# Patient Record
Sex: Male | Born: 1950
Health system: Southern US, Community
[De-identification: ages and names within clinical notes are randomized; demographics above are authoritative.]

## PROBLEM LIST (undated history)

## (undated) DIAGNOSIS — J45909 Unspecified asthma, uncomplicated: Secondary | ICD-10-CM

## (undated) DIAGNOSIS — I10 Essential (primary) hypertension: Secondary | ICD-10-CM

## (undated) DIAGNOSIS — T783XXA Angioneurotic edema, initial encounter: Secondary | ICD-10-CM

## (undated) HISTORY — DX: Essential (primary) hypertension: I10

## (undated) HISTORY — DX: Angioneurotic edema, initial encounter: T78.3XXA

## (undated) HISTORY — DX: Unspecified asthma, uncomplicated: J45.909

---

## 2016-04-09 DIAGNOSIS — R0982 Postnasal drip: Secondary | ICD-10-CM | POA: Diagnosis not present

## 2016-04-09 DIAGNOSIS — J309 Allergic rhinitis, unspecified: Secondary | ICD-10-CM | POA: Diagnosis not present

## 2016-04-09 DIAGNOSIS — F4322 Adjustment disorder with anxiety: Secondary | ICD-10-CM | POA: Diagnosis not present

## 2016-04-09 DIAGNOSIS — R0602 Shortness of breath: Secondary | ICD-10-CM | POA: Diagnosis not present

## 2016-05-21 DIAGNOSIS — R0609 Other forms of dyspnea: Secondary | ICD-10-CM | POA: Diagnosis not present

## 2016-06-10 DIAGNOSIS — R0609 Other forms of dyspnea: Secondary | ICD-10-CM | POA: Diagnosis not present

## 2016-07-02 DIAGNOSIS — J189 Pneumonia, unspecified organism: Secondary | ICD-10-CM | POA: Diagnosis not present

## 2016-07-02 DIAGNOSIS — R918 Other nonspecific abnormal finding of lung field: Secondary | ICD-10-CM | POA: Diagnosis not present

## 2016-07-02 DIAGNOSIS — R0602 Shortness of breath: Secondary | ICD-10-CM | POA: Diagnosis not present

## 2016-07-10 DIAGNOSIS — Z0000 Encounter for general adult medical examination without abnormal findings: Secondary | ICD-10-CM | POA: Diagnosis not present

## 2016-07-22 DIAGNOSIS — J189 Pneumonia, unspecified organism: Secondary | ICD-10-CM | POA: Diagnosis not present

## 2016-08-07 DIAGNOSIS — J189 Pneumonia, unspecified organism: Secondary | ICD-10-CM | POA: Diagnosis not present

## 2016-08-20 DIAGNOSIS — J189 Pneumonia, unspecified organism: Secondary | ICD-10-CM | POA: Diagnosis not present

## 2016-08-20 DIAGNOSIS — Z23 Encounter for immunization: Secondary | ICD-10-CM | POA: Diagnosis not present

## 2016-08-20 DIAGNOSIS — R0602 Shortness of breath: Secondary | ICD-10-CM | POA: Diagnosis not present

## 2016-08-20 DIAGNOSIS — Z125 Encounter for screening for malignant neoplasm of prostate: Secondary | ICD-10-CM | POA: Diagnosis not present

## 2016-08-20 DIAGNOSIS — Z0000 Encounter for general adult medical examination without abnormal findings: Secondary | ICD-10-CM | POA: Diagnosis not present

## 2016-08-20 DIAGNOSIS — B353 Tinea pedis: Secondary | ICD-10-CM | POA: Diagnosis not present

## 2016-08-20 DIAGNOSIS — R7309 Other abnormal glucose: Secondary | ICD-10-CM | POA: Diagnosis not present

## 2016-10-13 DIAGNOSIS — J449 Chronic obstructive pulmonary disease, unspecified: Secondary | ICD-10-CM | POA: Diagnosis not present

## 2016-10-31 DIAGNOSIS — H2511 Age-related nuclear cataract, right eye: Secondary | ICD-10-CM | POA: Diagnosis not present

## 2016-11-11 DIAGNOSIS — J449 Chronic obstructive pulmonary disease, unspecified: Secondary | ICD-10-CM | POA: Diagnosis not present

## 2016-11-11 DIAGNOSIS — K219 Gastro-esophageal reflux disease without esophagitis: Secondary | ICD-10-CM | POA: Diagnosis not present

## 2016-11-11 DIAGNOSIS — H2511 Age-related nuclear cataract, right eye: Secondary | ICD-10-CM | POA: Diagnosis not present

## 2016-11-11 DIAGNOSIS — Z87891 Personal history of nicotine dependence: Secondary | ICD-10-CM | POA: Diagnosis not present

## 2016-11-11 DIAGNOSIS — Z79899 Other long term (current) drug therapy: Secondary | ICD-10-CM | POA: Diagnosis not present

## 2016-11-11 DIAGNOSIS — H9190 Unspecified hearing loss, unspecified ear: Secondary | ICD-10-CM | POA: Diagnosis not present

## 2017-02-12 DIAGNOSIS — K219 Gastro-esophageal reflux disease without esophagitis: Secondary | ICD-10-CM | POA: Diagnosis not present

## 2017-02-12 DIAGNOSIS — J449 Chronic obstructive pulmonary disease, unspecified: Secondary | ICD-10-CM | POA: Diagnosis not present

## 2017-02-12 DIAGNOSIS — C44519 Basal cell carcinoma of skin of other part of trunk: Secondary | ICD-10-CM | POA: Diagnosis not present

## 2017-02-18 DIAGNOSIS — L820 Inflamed seborrheic keratosis: Secondary | ICD-10-CM | POA: Diagnosis not present

## 2017-02-18 DIAGNOSIS — L570 Actinic keratosis: Secondary | ICD-10-CM | POA: Diagnosis not present

## 2017-02-18 DIAGNOSIS — L821 Other seborrheic keratosis: Secondary | ICD-10-CM | POA: Diagnosis not present

## 2017-02-18 DIAGNOSIS — D1801 Hemangioma of skin and subcutaneous tissue: Secondary | ICD-10-CM | POA: Diagnosis not present

## 2017-08-31 DIAGNOSIS — R109 Unspecified abdominal pain: Secondary | ICD-10-CM | POA: Diagnosis not present

## 2017-08-31 DIAGNOSIS — Z23 Encounter for immunization: Secondary | ICD-10-CM | POA: Diagnosis not present

## 2017-08-31 DIAGNOSIS — Z6828 Body mass index (BMI) 28.0-28.9, adult: Secondary | ICD-10-CM | POA: Diagnosis not present

## 2017-09-10 DIAGNOSIS — K5792 Diverticulitis of intestine, part unspecified, without perforation or abscess without bleeding: Secondary | ICD-10-CM | POA: Diagnosis not present

## 2017-09-10 DIAGNOSIS — K219 Gastro-esophageal reflux disease without esophagitis: Secondary | ICD-10-CM | POA: Diagnosis not present

## 2017-09-10 DIAGNOSIS — Z9181 History of falling: Secondary | ICD-10-CM | POA: Diagnosis not present

## 2017-09-10 DIAGNOSIS — J449 Chronic obstructive pulmonary disease, unspecified: Secondary | ICD-10-CM | POA: Diagnosis not present

## 2017-09-10 DIAGNOSIS — Z1389 Encounter for screening for other disorder: Secondary | ICD-10-CM | POA: Diagnosis not present

## 2017-09-10 DIAGNOSIS — E663 Overweight: Secondary | ICD-10-CM | POA: Diagnosis not present

## 2017-10-26 DIAGNOSIS — J449 Chronic obstructive pulmonary disease, unspecified: Secondary | ICD-10-CM | POA: Diagnosis not present

## 2017-10-27 DIAGNOSIS — Z6828 Body mass index (BMI) 28.0-28.9, adult: Secondary | ICD-10-CM | POA: Diagnosis not present

## 2017-10-27 DIAGNOSIS — M940 Chondrocostal junction syndrome [Tietze]: Secondary | ICD-10-CM | POA: Diagnosis not present

## 2017-10-27 DIAGNOSIS — Z1339 Encounter for screening examination for other mental health and behavioral disorders: Secondary | ICD-10-CM | POA: Diagnosis not present

## 2017-10-27 DIAGNOSIS — J441 Chronic obstructive pulmonary disease with (acute) exacerbation: Secondary | ICD-10-CM | POA: Diagnosis not present

## 2017-10-27 DIAGNOSIS — Z0000 Encounter for general adult medical examination without abnormal findings: Secondary | ICD-10-CM | POA: Diagnosis not present

## 2017-11-26 DIAGNOSIS — K219 Gastro-esophageal reflux disease without esophagitis: Secondary | ICD-10-CM | POA: Diagnosis not present

## 2017-11-26 DIAGNOSIS — K4191 Unilateral femoral hernia, without obstruction or gangrene, recurrent: Secondary | ICD-10-CM | POA: Diagnosis not present

## 2017-11-26 DIAGNOSIS — Z23 Encounter for immunization: Secondary | ICD-10-CM | POA: Diagnosis not present

## 2017-11-26 DIAGNOSIS — J449 Chronic obstructive pulmonary disease, unspecified: Secondary | ICD-10-CM | POA: Diagnosis not present

## 2017-11-26 DIAGNOSIS — G250 Essential tremor: Secondary | ICD-10-CM | POA: Diagnosis not present

## 2017-12-17 DIAGNOSIS — R1012 Left upper quadrant pain: Secondary | ICD-10-CM | POA: Diagnosis not present

## 2017-12-23 DIAGNOSIS — R1012 Left upper quadrant pain: Secondary | ICD-10-CM | POA: Diagnosis not present

## 2017-12-23 DIAGNOSIS — N200 Calculus of kidney: Secondary | ICD-10-CM | POA: Diagnosis not present

## 2018-01-29 ENCOUNTER — Other Ambulatory Visit (INDEPENDENT_AMBULATORY_CARE_PROVIDER_SITE_OTHER): Payer: PPO

## 2018-01-29 ENCOUNTER — Ambulatory Visit: Payer: PPO | Admitting: Internal Medicine

## 2018-01-29 ENCOUNTER — Ambulatory Visit (INDEPENDENT_AMBULATORY_CARE_PROVIDER_SITE_OTHER)
Admission: RE | Admit: 2018-01-29 | Discharge: 2018-01-29 | Disposition: A | Discharge status: Home / Self Care | Payer: PPO | Source: Ambulatory Visit | Attending: Internal Medicine | Admitting: Internal Medicine

## 2018-01-29 ENCOUNTER — Encounter: Payer: Self-pay | Admitting: Internal Medicine

## 2018-01-29 VITALS — BP 132/80 | HR 85 | Ht 71.0 in | Wt 203.6 lb

## 2018-01-29 DIAGNOSIS — R0609 Other forms of dyspnea: Secondary | ICD-10-CM | POA: Insufficient documentation

## 2018-01-29 DIAGNOSIS — J45991 Cough variant asthma: Secondary | ICD-10-CM | POA: Diagnosis not present

## 2018-01-29 DIAGNOSIS — R059 Cough, unspecified: Secondary | ICD-10-CM

## 2018-01-29 DIAGNOSIS — R0602 Shortness of breath: Secondary | ICD-10-CM | POA: Diagnosis not present

## 2018-01-29 DIAGNOSIS — R05 Cough: Secondary | ICD-10-CM

## 2018-01-29 LAB — CBC WITH DIFFERENTIAL/PLATELET
BASOS ABS: 0.1 10*3/uL (ref 0.0–0.1)
Basophils Relative: 1 % (ref 0.0–3.0)
EOS ABS: 0.3 10*3/uL (ref 0.0–0.7)
Eosinophils Relative: 4 % (ref 0.0–5.0)
HEMATOCRIT: 45.8 % (ref 39.0–52.0)
Hemoglobin: 15.5 g/dL (ref 13.0–17.0)
LYMPHS PCT: 21 % (ref 12.0–46.0)
Lymphs Abs: 1.8 10*3/uL (ref 0.7–4.0)
MCHC: 33.8 g/dL (ref 30.0–36.0)
MCV: 92.4 fl (ref 78.0–100.0)
MONOS PCT: 13.7 % — AB (ref 3.0–12.0)
Monocytes Absolute: 1.2 10*3/uL — ABNORMAL HIGH (ref 0.1–1.0)
Neutro Abs: 5.3 10*3/uL (ref 1.4–7.7)
Neutrophils Relative %: 60.3 % (ref 43.0–77.0)
Platelets: 375 10*3/uL (ref 150.0–400.0)
RBC: 4.96 Mil/uL (ref 4.22–5.81)
RDW: 13.4 % (ref 11.5–15.5)
WBC: 8.7 10*3/uL (ref 4.0–10.5)

## 2018-01-29 LAB — SEDIMENTATION RATE: SED RATE: 13 mm/h (ref 0–20)

## 2018-01-29 LAB — TSH: TSH: 1.78 u[IU]/mL (ref 0.35–4.50)

## 2018-01-29 LAB — BRAIN NATRIURETIC PEPTIDE: PRO B NATRI PEPTIDE: 37 pg/mL (ref 0.0–100.0)

## 2018-01-29 MED ORDER — MOMETASONE FURO-FORMOTEROL FUM 100-5 MCG/ACT IN AERO: 2.0000 | INHALATION_SPRAY | Freq: Two times a day (BID) | RESPIRATORY_TRACT | 11 refills | Status: DC

## 2018-01-29 MED ORDER — PANTOPRAZOLE SODIUM 40 MG PO TBEC: 40.0000 mg | DELAYED_RELEASE_TABLET | Freq: Every day | ORAL | 2 refills | Status: DC

## 2018-01-29 MED ORDER — FAMOTIDINE 20 MG PO TABS: ORAL_TABLET | ORAL | 2 refills | Status: DC

## 2018-01-29 MED ORDER — MOMETASONE FURO-FORMOTEROL FUM 100-5 MCG/ACT IN AERO: 2.0000 | INHALATION_SPRAY | Freq: Two times a day (BID) | RESPIRATORY_TRACT | 0 refills | Status: DC

## 2018-01-29 NOTE — Assessment & Plan Note (Addendum)
Spirometry 01/29/2018  Abn effort dep portion only  - 01/29/2018  After extensive coaching inhaler device  effectiveness =   75 % start dulera 100  2bid and d/c trelegy as this is not typical copd at all    Not clear whether he actually has a lung problem but clearly not copd so try max rx for gerd/ change to low dose dulera and await sinus ct/ allergy profile then regroup in 6 weeks

## 2018-01-29 NOTE — Assessment & Plan Note (Signed)
01/29/2018  Walked RA x 3 laps @ 185 ft each stopped due to  End of study, fast pace, no  desat  But sob at end    Symptoms are markedly disproportionate to objective findings and not clear this is actually much of a  lung problem but pt does appear to have difficult to sort out respiratory symptoms of unknown origin for which  DDX  = almost all start with A and  include Adherence, Ace Inhibitors, Acid Reflux, Active Sinus Disease, Alpha 1 Antitripsin deficiency, Anxiety masquerading as Airways dz,  ABPA,  Allergy(esp in young), Aspiration (esp in elderly), Adverse effects of meds,  Active smokers, A bunch of PE's (a small clot burden can't cause this syndrome unless there is already severe underlying pulm or vascular dz with poor reserve), Anemia and thyroid dz  plus two Bs  = Bronchiectasis and Beta blocker use..and one C= CHF    Adherence is always the initial "prime suspect" and is a multilayered concern that requires a "trust but verify" approach in every patient - starting with knowing how to use medications, especially inhalers, correctly, keeping up with refills and understanding the fundamental difference between maintenance and prns vs those medications only taken for a very short course and then stopped and not refilled.  - see hfa teaching - return with all meds in hand using a trust but verify approach to confirm accurate Medication  Reconciliation The principal here is that until we are certain that the  patients are doing what we've asked, it makes no sense to ask them to do more.    ? Acid (or non-acid) GERD > always difficult to exclude as up to 75% of pts in some series report no assoc GI/ Heartburn symptoms> rec max (24h)  acid suppression and diet restrictions/ reviewed and instructions given in writing.    ? Adverse effects of dpi > change to hfa low dose ics   ? Active sinus dz > check ct next  ? Allergy/ asthma > send profile/ rx with low dose ics only for now  ? Anemia/  thyroid dz > excluded by today's labs  ? CHF > excluded by such a low bnp   Total time devoted to counseling  > 50 % of initial 60 min office visit:  review case with pt/ discussion of options/alternatives/ personally creating written customized instructions  in presence of pt  then going over those specific  Instructions directly with the pt including how to use all of the meds but in particular covering each new medication in detail and the difference between the maintenance= "automatic" meds and the prns using an action plan format for the latter (If this problem/symptom => do that organization reading Left to right).  Please see AVS from this visit for a full list of these instructions which I personally wrote for this pt and  are unique to this visit.

## 2018-01-29 NOTE — Patient Instructions (Addendum)
Plan A = Automatic =  Stop trelegy and start Dulera 100 Take 2 puffs first thing in am and then another 2 puffs about 12 hours later.   Pantoprazole (protonix) 40 mg  (Prilosec 20 x 2)  Take  30-60 min before first meal of the day and Pepcid (famotidine)  20 mg one hour before bedtime until return to office - this is the best way to tell whether stomach acid is contributing to your problem.     Plan B = Backup for breathing  Only use your duoneb if you can't catch your breath.  Ok to use up to every 4 hours if needed   Plan B = Backup  For cough cough > mucinex dm up to 1200 mg every 12 hours if needed   Please see patient coordinator before you leave today  to schedule Sinus CT    Please remember to go to the lab and x-ray department downstairs in the basement  for your tests - we will call you with the results when they are available.     Please schedule a follow up office visit in 6 weeks, call sooner if needed with all medications /inhalers/ solutions in hand so we can verify exactly what you are taking. This includes all medications from all doctors and over the counters

## 2018-01-29 NOTE — Progress Notes (Signed)
Subjective:     Patient ID: Joshua Grant, male   DOB: [birth year],    MRN: [id number]  HPI   69 yowm quit smoking 2012 with h/o sinus problems from his late 15s requiring surgery in 1990's which helped for while with new onset sob and cough onset fall 2017 and during this same time more nasal discharge of green mucus and multiple abx did not help so referred to pulmonary clinic 01/29/2018  By Dr Morrison Old      01/29/2018 1st [facility] office visit/ Casmira Cramer   Chief Complaint  Patient presents with  . Pulmonary Consult    Self referral. Pt c/o DOE for the past year. He states he gets winded with lifting things and with walking up an incline. He also c/o prod cough with green sputum for the past wk.   symptoms insidious onset/ persisten daily pattern: at onset of day has slept at 45 degrees and lots of am congestion green x couple tbsp seems better during the day and worse p supper / before bed does better breathe right and 45 degrees  No better with trelegy  Neb helps the most  Doe = .MMRC1 = can walk nl pace, flat grade, can't hurry or go uphills or steps s sob     No obvious day to day or daytime variability or assoc  mucus plugs or hemoptysis or cp or chest tightness, subjective wheeze or overt sinus or hb symptoms. No unusual exposure hx or h/o childhood pna/ asthma or knowledge of premature birth.    Also denies any obvious fluctuation of symptoms with weather or environmental changes or other aggravating or alleviating factors except as outlined above   Current Allergies, Complete Past Medical History, Past Surgical History, Family History, and Social History were reviewed in [employer] record.  ROS  The following are not active complaints unless bolded Hoarseness, sore throat, dysphagia, dental problems, itching, sneezing,  nasal congestion or discharge of excess mucus or purulent secretions, ear ache,   fever, chills, sweats, unintended wt loss or wt gain,  classically pleuritic or exertional cp,  orthopnea pnd or leg swelling, presyncope, palpitations, abdominal pain, anorexia, nausea, vomiting, diarrhea  or change in bowel habits or change in bladder habits, change in stools or change in urine, dysuria, hematuria,  rash, arthralgias, visual complaints, headache, numbness, weakness or ataxia or problems with walking or coordination,  change in mood/affect or memory.        Current Meds  Medication Sig  . Fluticasone-Umeclidin-Vilant (TRELEGY ELLIPTA) 100-62.5-25 MCG/INH AEPB Inhale 1 tablet into the lungs daily.  Marland Kitchen ipratropium-albuterol (DUONEB) 0.5-2.5 (3) MG/3ML SOLN Take 3 mLs by nebulization every 4 (four) hours as needed.  prilosec 20mg   At hs         Review of Systems     Objective:   Physical Exam Animated pleasant amb wm nad  Wt Readings from Last 3 Encounters:  01/29/18 203 lb 9.6 oz (92.4 kg)     Vital signs reviewed - Note on arrival 02 sats  96% on RA   HEENT: nl   oropharynx. Nl external ear canals without cough reflex - moderate bilateral non-specific turbinate edema  / edentulous   NECK :  without JVD/Nodes/TM/ nl carotid upstrokes bilaterally   LUNGS: no acc muscle use,  Nl contour chest with mostly pseudowheeze on exam    CV:  RRR  no s3 or murmur or increase in P2, and no edema   ABD:  soft  and nontender with nl inspiratory excursion in the supine position. No bruits or organomegaly appreciated, bowel sounds nl  MS:  Nl gait/ ext warm without deformities, calf tenderness, cyanosis or clubbing No obvious joint restrictions   SKIN: warm and dry without lesions    NEURO:  alert, approp, nl sensorium with  no motor or cerebellar deficits apparent.       CXR PA and Lateral:   01/29/2018 :    I personally reviewed images   impression as follows:    mild copd/ coarse markings    Labs ordered/ reviewed:     Lab Results  Component Value Date   WBC 8.7 01/29/2018   HGB 15.5 01/29/2018   HCT 45.8  01/29/2018   MCV 92.4 01/29/2018   PLT 375.0 01/29/2018       Eos                         0.3                                                                  01/29/2018       Lab Results  Component Value Date   TSH 1.78 01/29/2018     Lab Results  Component Value Date   PROBNP 37.0 01/29/2018       Lab Results  Component Value Date   ESRSEDRATE 13 01/29/2018       Labs ordered 01/29/2018    Allergy profile     Assessment:

## 2018-02-01 ENCOUNTER — Telehealth: Payer: Self-pay | Admitting: Internal Medicine

## 2018-02-01 LAB — RESPIRATORY ALLERGY PROFILE REGION II ~~LOC~~
ALLERGEN, CEDAR TREE, T6: 0.58 kU/L — AB
ALLERGEN, OAK, T7: 0.77 kU/L — AB
ASPERGILLUS FUMIGATUS M3: 0.73 kU/L — AB
Allergen, A. alternata, m6: 0.56 kU/L — ABNORMAL HIGH
Allergen, Comm Silver Birch, t9: 0.49 kU/L — ABNORMAL HIGH
Allergen, Cottonwood, t14: 0.64 kU/L — ABNORMAL HIGH
Allergen, D pternoyssinus,d7: 0.56 kU/L — ABNORMAL HIGH
Allergen, Mulberry, t76: 0.51 kU/L — ABNORMAL HIGH
Allergen, P. notatum, m1: 0.43 kU/L — ABNORMAL HIGH
BOX ELDER: 0.67 kU/L — AB
Bermuda Grass: 0.92 kU/L — ABNORMAL HIGH
CLADOSPORIUM HERBARUM (M2) IGE: 0.14 kU/L — ABNORMAL HIGH
CLASS: 1
CLASS: 1
CLASS: 1
CLASS: 1
CLASS: 1
CLASS: 2
CLASS: 2
CLASS: 2
CLASS: 2
CLASS: 3
COCKROACH: 12.6 kU/L — AB
COMMON RAGWEED (SHORT) (W1) IGE: 0.78 kU/L — ABNORMAL HIGH
Cat Dander: 0.59 kU/L — ABNORMAL HIGH
Class: 0
Class: 0
Class: 1
Class: 1
Class: 1
Class: 1
Class: 1
Class: 1
Class: 1
Class: 1
Class: 1
Class: 2
Class: 2
Class: 2
D. FARINAE: 2.01 kU/L — AB
Dog Dander: 0.64 kU/L — ABNORMAL HIGH
ELM IGE: 0.69 kU/L — AB
IgE (Immunoglobulin E), Serum: 702 kU/L — ABNORMAL HIGH (ref <=114)
Johnson Grass: 0.97 kU/L — ABNORMAL HIGH
Pecan/Hickory Tree IgE: 0.56 kU/L — ABNORMAL HIGH
ROUGH PIGWEED IGE: 0.69 kU/L — AB
Sheep Sorrel IgE: 0.7 kU/L — ABNORMAL HIGH
TIMOTHY GRASS: 0.68 kU/L — AB

## 2018-02-01 LAB — INTERPRETATION:

## 2018-02-01 NOTE — Telephone Encounter (Signed)
Patient is aware of results. Verbalized understanding. Nothing further needed. 

## 2018-02-01 NOTE — Progress Notes (Signed)
LMTCB

## 2018-02-02 NOTE — Progress Notes (Signed)
Spoke with pt and notified of results per Dr. Wert. Pt verbalized understanding and denied any questions. 

## 2018-02-08 ENCOUNTER — Ambulatory Visit (INDEPENDENT_AMBULATORY_CARE_PROVIDER_SITE_OTHER)
Admission: RE | Admit: 2018-02-08 | Discharge: 2018-02-08 | Disposition: A | Discharge status: Home / Self Care | Payer: PPO | Source: Ambulatory Visit | Attending: Internal Medicine | Admitting: Internal Medicine

## 2018-02-08 DIAGNOSIS — R05 Cough: Secondary | ICD-10-CM

## 2018-02-08 DIAGNOSIS — J45991 Cough variant asthma: Secondary | ICD-10-CM

## 2018-02-08 DIAGNOSIS — R059 Cough, unspecified: Secondary | ICD-10-CM

## 2018-02-08 DIAGNOSIS — J0121 Acute recurrent ethmoidal sinusitis: Secondary | ICD-10-CM | POA: Diagnosis not present

## 2018-02-09 ENCOUNTER — Telehealth: Payer: Self-pay | Admitting: Internal Medicine

## 2018-02-09 DIAGNOSIS — J329 Chronic sinusitis, unspecified: Secondary | ICD-10-CM

## 2018-02-09 NOTE — Telephone Encounter (Signed)
Called and spoke with pt giving him the results of the ct scan of sinus and that MW wanted pt to be referred to ENT.  Pt and his wife both expressed understanding. Order placed for the referral to ENT.  Nothing further needed at this current time.

## 2018-02-09 NOTE — Progress Notes (Signed)
LMTCB

## 2018-02-16 DIAGNOSIS — J329 Chronic sinusitis, unspecified: Secondary | ICD-10-CM | POA: Diagnosis not present

## 2018-02-16 DIAGNOSIS — R05 Cough: Secondary | ICD-10-CM | POA: Diagnosis not present

## 2018-02-18 ENCOUNTER — Telehealth: Payer: Self-pay | Admitting: Internal Medicine

## 2018-02-18 NOTE — Telephone Encounter (Signed)
ATC x3 and received busy dial. Will call back.

## 2018-02-18 NOTE — Telephone Encounter (Signed)
lmtcb x1 for [employer].

## 2018-02-18 NOTE — Telephone Encounter (Signed)
Pt is returning call. Cb is [phone].

## 2018-02-19 ENCOUNTER — Telehealth: Payer: Self-pay | Admitting: Internal Medicine

## 2018-02-19 MED ORDER — BUDESONIDE-FORMOTEROL FUMARATE 160-4.5 MCG/ACT IN AERO: 2.0000 | INHALATION_SPRAY | Freq: Two times a day (BID) | RESPIRATORY_TRACT | 11 refills | Status: DC

## 2018-02-19 MED ORDER — BUDESONIDE-FORMOTEROL FUMARATE 80-4.5 MCG/ACT IN AERO: 2.0000 | INHALATION_SPRAY | Freq: Two times a day (BID) | RESPIRATORY_TRACT | 11 refills | Status: DC

## 2018-02-19 NOTE — Telephone Encounter (Signed)
Called and spoke with pt's wife Parke Simmers letting her know we were changing her husband's inhaler from [facility] to Symbicort.  Parke Simmers expressed understanding.  Dulera removed from pt's med list and sent Rx of Symbicort 80 to pt's preferred pharmacy.  Nothing further needed at this current time.

## 2018-02-19 NOTE — Telephone Encounter (Signed)
Spoke with nurse and pharmacist, verified that pt is to be on symbicort 80.  Nothing further needed.

## 2018-02-19 NOTE — Telephone Encounter (Signed)
symbicort 80 2bid 

## 2018-02-19 NOTE — Telephone Encounter (Signed)
Spoke with Parke Simmers , she states Joshua Grant is not covered but they will cover the inhalers listed below. MW please advise.   Symbicort, Advair HFA, Breo  He had tried [place] but didn't like the powder

## 2018-03-09 DIAGNOSIS — Z6829 Body mass index (BMI) 29.0-29.9, adult: Secondary | ICD-10-CM | POA: Diagnosis not present

## 2018-03-09 DIAGNOSIS — N200 Calculus of kidney: Secondary | ICD-10-CM | POA: Diagnosis not present

## 2018-03-12 ENCOUNTER — Encounter: Payer: Self-pay | Admitting: Internal Medicine

## 2018-03-12 ENCOUNTER — Ambulatory Visit (INDEPENDENT_AMBULATORY_CARE_PROVIDER_SITE_OTHER): Payer: PPO | Admitting: Internal Medicine

## 2018-03-12 VITALS — BP 130/80 | HR 100 | Ht 71.0 in | Wt 203.8 lb

## 2018-03-12 DIAGNOSIS — J45991 Cough variant asthma: Secondary | ICD-10-CM

## 2018-03-12 LAB — NITRIC OXIDE: Nitric Oxide: 30

## 2018-03-12 MED ORDER — MONTELUKAST SODIUM 10 MG PO TABS: 10.0000 mg | ORAL_TABLET | Freq: Every day | ORAL | 11 refills | Status: DC

## 2018-03-12 MED ORDER — BUDESONIDE-FORMOTEROL FUMARATE 80-4.5 MCG/ACT IN AERO: 2.0000 | INHALATION_SPRAY | Freq: Two times a day (BID) | RESPIRATORY_TRACT | 0 refills | Status: DC

## 2018-03-12 NOTE — Progress Notes (Deleted)
spirpo

## 2018-03-12 NOTE — Patient Instructions (Signed)
Add singulair 10 mg each pm   Continue symbicort 80 Take 2 puffs first thing in am and then another 2 puffs about 12 hours later.     Keep up with your ent evaluation   Please schedule a follow up office visit in 6 weeks, call sooner if needed with all medications /inhalers/ solutions in hand so we can verify exactly what you are taking. This includes all medications from all doctors and over the counters

## 2018-03-12 NOTE — Assessment & Plan Note (Signed)
Spirometry 01/29/2018  Abn effort dep portion only  - 01/29/2018  start dulera 100  2bid and d/c trelegy as this is not typical copd at all  - Sinus CT 02/09/2018 Postsurgical changes related to prior functional endoscopic sinus surgery. Moderate left maxillary sinus opacification.  - max rx for GERD 01/29/2018  - Allergy profile 01/29/18 >  Eos 0.3 /  IgE  702  RAST pos Dust, ragweed, grass, trees , cats, dogs   - Spirometry 03/12/2018  FEV1 3.55 (90%)  Ratio 80 min curve - FENO 03/12/2018  =   30 on symb 80 2bid > add singulair   - 03/12/2018  After extensive coaching inhaler device  effectiveness =    90%    Clearly improved despite lingering pnds from sinus dz but in setting of severe allergies good candidate for singulair trial.   Discussed in detail all the  indications, usual  risks and alternatives  relative to the benefits with patient who agrees to proceed with w/u as outlined.     I had an extended discussion with the patient and wife reviewing all relevant studies completed to date and  lasting 15 to 20 minutes of a 25 minute visit    Each maintenance medication was reviewed in detail including most importantly the difference between maintenance and prns and under what circumstances the prns are to be triggered using an action plan format that is not reflected in the computer generated alphabetically organized AVS.    Please see AVS for specific instructions unique to this visit that I personally wrote and verbalized to the the pt in detail and then reviewed with pt  by my nurse highlighting any  changes in therapy recommended at today's visit to their plan of care.

## 2018-03-12 NOTE — Progress Notes (Signed)
Subjective:     Patient ID: Joshua Grant, male   DOB: 11-Jan-1951,    MRN: [id number]    Brief patient profile:  53 yowm quit smoking 2012 with h/o sinus problems from his late 61s requiring surgery in 1990's which helped for while with new onset sob and cough onset fall 2017 and during this same time more nasal discharge of green mucus and multiple abx did not help so referred to pulmonary clinic 01/29/2018  By Dr Morrison Old     History of Present Illness  01/29/2018 1st [facility] office visit/ Mataeo Ingwersen   Chief Complaint  Patient presents with  . Pulmonary Consult    Self referral. Pt c/o DOE for the past year. He states he gets winded with lifting things and with walking up an incline. He also c/o prod cough with green sputum for the past wk.   symptoms insidious onset/ persisten tdaily pattern: at onset of day has slept at 45 degrees and lots of am congestion green x couple tbsp seems better during the day and worse p supper / before bed does better breathe right and 45 degrees  No better with trelegy  Neb helps the most  Doe = .MMRC1 = can walk nl pace, flat grade, can't hurry or go uphills or steps s sob   rec Plan A = Automatic =  Stop trelegy and start Dulera 100 Take 2 puffs first thing in am and then another 2 puffs about 12 hours later.  Pantoprazole (protonix) 40 mg  (Prilosec 20 x 2)  Take  30-60 min before first meal of the day and Pepcid (famotidine)  20 mg one hour before bedtime until return to office - this is the best way to tell whether stomach acid is contributing to your problem.   Plan B = Backup for breathing  Only use your duoneb if you can't catch your breath.  Ok to use up to every 4 hours if needed  Plan B = Backup  For cough cough > mucinex dm up to 1200 mg every 12 hours if needed     -Sinus CT  02/09/18 > Postsurgical changes related to prior functional endoscopic sinus surgery. Moderate left maxillary sinus opacification. - Allergy profile 01/29/18 >  Eos  0.3 /  IgE  702  RAST pos Dust, ragweed, grass, trees , cats, dogs     03/12/2018  f/u ov/Nelma Phagan re: sob/cough x fall 2017  Chief Complaint  Patient presents with  . Follow-up    Breathing has improved some. He is still coughing with white to green sputum.  Recently took levaquin for sinus infection.    Dyspnea:  MMRC1 = can walk nl pace, flat grade, can't hurry or go uphills or steps s sob   Cough: after wakes/ still green in am even at end of levaquin which made him achy  Sleep: fine SABA use:  Rare   No obvious day to day or daytime variability or assoc excess/ purulent sputum or mucus plugs or hemoptysis or cp or chest tightness, subjective wheeze or overt sinus or hb symptoms. No unusual exposure hx or h/o childhood pna/ asthma or knowledge of premature birth.  Sleeping ok flat without nocturnal    exacerbation  of respiratory  c/o's or need for noct saba. Also denies any obvious fluctuation of symptoms with weather or environmental changes or other aggravating or alleviating factors except as outlined above   Current Allergies, Complete Past Medical History, Past Surgical History, Family History,  and Social History were reviewed in [employer] record.  ROS  The following are not active complaints unless bolded Hoarseness, sore throat, dysphagia, dental problems, itching, sneezing,  nasal congestion   discharge of excess mucus or purulent secretions, ear ache,   fever, chills, sweats, unintended wt loss or wt gain, classically pleuritic or exertional cp,  orthopnea pnd or leg swelling, presyncope, palpitations, abdominal pain, anorexia, nausea, vomiting, diarrhea  or change in bowel habits or change in bladder habits, change in stools or change in urine, dysuria, hematuria,  rash, arthralgias, visual complaints, headache, numbness, weakness or ataxia or problems with walking or coordination,  change in mood/affect or memory.        Current Meds  Medication Sig  .  budesonide-formoterol (SYMBICORT) 80-4.5 MCG/ACT inhaler Inhale 2 puffs into the lungs 2 (two) times daily.  . famotidine (PEPCID) 20 MG tablet One at bedtime  . ipratropium-albuterol (DUONEB) 0.5-2.5 (3) MG/3ML SOLN Take 3 mLs by nebulization every 4 (four) hours as needed.  . pantoprazole (PROTONIX) 40 MG tablet Take 1 tablet (40 mg total) by mouth daily. Take 30-60 min before first meal of the day           Objective:   Physical Exam    amb wm nad  - all smiles     Wt Readings from Last 3 Encounters:  03/12/18 203 lb 12.8 oz (92.4 kg)  01/29/18 203 lb 9.6 oz (92.4 kg)     Vital signs reviewed - Note on arrival 02 sats  100% on RA     03/12/2018    HEENT: nl  oropharynx. Nl external ear canals without cough reflex- edentulous with moderate bilateral non-specific turbinate edema  - mucoid secretions    NECK :  without JVD/Nodes/TM/ nl carotid upstrokes bilaterally   LUNGS: no acc muscle use,  Nl contour chest which is clear to A and P bilaterally without cough on insp or exp maneuvers   CV:  RRR  no s3 or murmur or increase in P2, and no edema   ABD:  soft and nontender with nl inspiratory excursion in the supine position. No bruits or organomegaly appreciated, bowel sounds nl  MS:  Nl gait/ ext warm without deformities, calf tenderness, cyanosis or clubbing No obvious joint restrictions   SKIN: warm and dry without lesions    NEURO:  alert, approp, nl sensorium with  no motor or cerebellar deficits apparent.                        Assessment:

## 2018-03-19 DIAGNOSIS — J324 Chronic pansinusitis: Secondary | ICD-10-CM | POA: Diagnosis not present

## 2018-03-19 DIAGNOSIS — M950 Acquired deformity of nose: Secondary | ICD-10-CM | POA: Diagnosis not present

## 2018-03-19 DIAGNOSIS — R05 Cough: Secondary | ICD-10-CM | POA: Diagnosis not present

## 2018-03-19 DIAGNOSIS — J310 Chronic rhinitis: Secondary | ICD-10-CM | POA: Diagnosis not present

## 2018-03-19 DIAGNOSIS — J329 Chronic sinusitis, unspecified: Secondary | ICD-10-CM | POA: Diagnosis not present

## 2018-04-14 DIAGNOSIS — F419 Anxiety disorder, unspecified: Secondary | ICD-10-CM | POA: Diagnosis not present

## 2018-04-14 DIAGNOSIS — J441 Chronic obstructive pulmonary disease with (acute) exacerbation: Secondary | ICD-10-CM | POA: Diagnosis not present

## 2018-04-14 DIAGNOSIS — Z6828 Body mass index (BMI) 28.0-28.9, adult: Secondary | ICD-10-CM | POA: Diagnosis not present

## 2018-04-23 ENCOUNTER — Encounter: Payer: Self-pay | Admitting: Internal Medicine

## 2018-04-23 ENCOUNTER — Ambulatory Visit (INDEPENDENT_AMBULATORY_CARE_PROVIDER_SITE_OTHER): Payer: PPO | Admitting: Internal Medicine

## 2018-04-23 ENCOUNTER — Ambulatory Visit (INDEPENDENT_AMBULATORY_CARE_PROVIDER_SITE_OTHER)
Admission: RE | Admit: 2018-04-23 | Discharge: 2018-04-23 | Disposition: A | Discharge status: Home / Self Care | Payer: PPO | Source: Ambulatory Visit | Attending: Internal Medicine | Admitting: Internal Medicine

## 2018-04-23 VITALS — BP 130/74 | HR 85 | Ht 70.5 in | Wt 203.8 lb

## 2018-04-23 DIAGNOSIS — R0602 Shortness of breath: Secondary | ICD-10-CM | POA: Diagnosis not present

## 2018-04-23 DIAGNOSIS — J45991 Cough variant asthma: Secondary | ICD-10-CM

## 2018-04-23 DIAGNOSIS — R0609 Other forms of dyspnea: Secondary | ICD-10-CM

## 2018-04-23 DIAGNOSIS — R05 Cough: Secondary | ICD-10-CM | POA: Diagnosis not present

## 2018-04-23 NOTE — Patient Instructions (Addendum)
Finish your prednisone / zpak  Please see patient coordinator before you leave today  to schedule cpst   Please schedule a follow up office visit in 6 weeks, call sooner if needed

## 2018-04-23 NOTE — Progress Notes (Signed)
Subjective:     Patient ID: Joshua Grant, male   DOB: [birth year],    MRN: [id number]    Brief patient profile:  33 yowm quit smoking 2012 with h/o sinus problems from his late 37s requiring surgery in 1990's which helped for while with new onset sob and cough onset fall 2017 and during this same time more nasal discharge of green mucus and multiple abx did not help so referred to pulmonary clinic 01/29/2018  By Dr Morrison Old     History of Present Illness  01/29/2018 1st [facility] office visit/ Jaquawn Saffran   Chief Complaint  Patient presents with  . Pulmonary Consult    Self referral. Pt c/o DOE for the past year. He states he gets winded with lifting things and with walking up an incline. He also c/o prod cough with green sputum for the past wk.   symptoms insidious onset/ persisten tdaily pattern: at onset of day has slept at 45 degrees and lots of am congestion green x couple tbsp seems better during the day and worse p supper / before bed does better breathe right and 45 degrees  No better with trelegy  Neb helps the most  Doe = .MMRC1 = can walk nl pace, flat grade, can't hurry or go uphills or steps s sob   rec Plan A = Automatic =  Stop trelegy and start Dulera 100 Take 2 puffs first thing in am and then another 2 puffs about 12 hours later.  Pantoprazole (protonix) 40 mg  (Prilosec 20 x 2)  Take  30-60 min before first meal of the day and Pepcid (famotidine)  20 mg one hour before bedtime until return to office - this is the best way to tell whether stomach acid is contributing to your problem.   Plan B = Backup for breathing  Only use your duoneb if you can't catch your breath.  Ok to use up to every 4 hours if needed  Plan B = Backup  For cough cough > mucinex dm up to 1200 mg every 12 hours if needed     -Sinus CT  02/09/18 > Postsurgical changes related to prior functional endoscopic sinus surgery. Moderate left maxillary sinus opacification. - Allergy profile 01/29/18 >  Eos  0.3 /  IgE  702  RAST pos Dust, ragweed, grass, trees , cats, dogs     03/12/2018  f/u ov/Decarlo Rivet re: sob/cough x fall 2017  Chief Complaint  Patient presents with  . Follow-up    Breathing has improved some. He is still coughing with white to green sputum.  Recently took levaquin for sinus infection.    Dyspnea:  MMRC1 = can walk nl pace, flat grade, can't hurry or go uphills or steps s sob   Cough: after wakes/ still green in am even at end of levaquin which made him achy  Sleep: fine SABA use:  Rare rec Add singulair 10 mg each pm  Continue symbicort 80 Take 2 puffs first thing in am and then another 2 puffs about 12 hours later.  Keep up with your ent evaluation Please schedule a follow up office visit in 6 weeks, call sooner if needed with all medications /inhalers/ solutions in hand so we can verify exactly what you are taking. This includes all medications from all doctors and over the counters     04/23/2018  f/u ov/Jarrel Knoke re:  Cough and sob on singulair/ symb 90 / gerd rx  Chief Complaint  Patient presents with  .  Follow-up    Pt states had "episode" approx 1 wk ago- increased BP and CP- went to Dr Nicki Reaper and was advised he had lung infection- given zithromax and pred and feeling much better.   Dyspnea:  MMRC1 = can walk nl pace, flat grade, can't hurry or go uphills or steps s sob   Cough: not as bad in am / tends to cough during the day  Sleep: able to sleep hoizontal on L /  SABA use:  Neb twice since last ov  Still having sensation of nasal obst and plans to see [facility] ent  Overall better with cough but doe   about the same since fall 2017  Got upset about aunt burial got stressed like before x 10 years with midline severe  cp  X 6-7 hours s rad n or V   then eased   worse with deep breath and saw Dr Nicki Reaper 3 days later rx pred zpak anxiety med and 100%c cp resolved    No obvious day to day or daytime variability or assoc excess/ purulent sputum or mucus plugs or hemoptysis or cp  or chest tightness, subjective wheeze or overt sinus or hb symptoms. No unusual exposure hx or h/o childhood pna/ asthma or knowledge of premature birth.  Sleeping  On L side/ horizontal positin   without nocturnal  or early am exacerbation  of respiratory  c/o's or need for noct saba. Also denies any obvious fluctuation of symptoms with weather or environmental changes or other aggravating or alleviating factors except as outlined above   Current Allergies, Complete Past Medical History, Past Surgical History, Family History, and Social History were reviewed in [employer] record.  ROS  The following are not active complaints unless bolded Hoarseness, sore throat, dysphagia, dental problems, itching, sneezing,  nasal congestion or discharge of excess mucus or purulent secretions, ear ache,   fever, chills, sweats, unintended wt loss or wt gain, classically pleuritic or exertional cp,  orthopnea pnd or arm/hand swelling  or leg swelling, presyncope, palpitations, abdominal pain, anorexia, nausea, vomiting, diarrhea  or change in bowel habits or change in bladder habits, change in stools or change in urine, dysuria, hematuria,  rash, arthralgias, visual complaints, headache, numbness, weakness or ataxia or problems with walking or coordination,  change in mood or  memory.        Current Meds  Medication Sig  . budesonide-formoterol (SYMBICORT) 80-4.5 MCG/ACT inhaler Inhale 2 puffs into the lungs 2 (two) times daily.  . famotidine (PEPCID) 20 MG tablet One at bedtime  . ipratropium-albuterol (DUONEB) 0.5-2.5 (3) MG/3ML SOLN Take 3 mLs by nebulization every 4 (four) hours as needed.  . montelukast (SINGULAIR) 10 MG tablet Take 1 tablet (10 mg total) by mouth at bedtime.  . pantoprazole (PROTONIX) 40 MG tablet Take 1 tablet (40 mg total) by mouth daily. Take 30-60 min before first meal of the day  . predniSONE (DELTASONE) 10 MG tablet Take 10 mg by mouth as directed.                     Objective:   Physical Exam     amb wm nad  04/23/2018       203   03/12/18 203 lb 12.8 oz (92.4 kg)  01/29/18 203 lb 9.6 oz (92.4 kg)     Vital signs reviewed - Note on arrival 02 sats  97% on RA        HEENT: nl  turbinates bilaterally, and oropharynx. Nl external ear canals without cough reflex - full dentures   NECK :  without JVD/Nodes/TM/ nl carotid upstrokes bilaterally   LUNGS: no acc muscle use,  Nl contour chest which is clear to A and P bilaterally without cough on insp or exp maneuvers   CV:  RRR  no s3 or murmur or increase in P2, and no edema   ABD:  soft and nontender with nl inspiratory excursion in the supine position. No bruits or organomegaly appreciated, bowel sounds nl  MS:  Nl gait/ ext warm without deformities, calf tenderness, cyanosis or clubbing No obvious joint restrictions   SKIN: warm and dry without lesions    NEURO:  alert, approp, nl sensorium with  no motor or cerebellar deficits apparent.                              Assessment:

## 2018-04-23 NOTE — Progress Notes (Signed)
Spoke with pt and notified of results per Dr. Wert. Pt verbalized understanding and denied any questions. 

## 2018-04-26 ENCOUNTER — Encounter: Payer: Self-pay | Admitting: Internal Medicine

## 2018-04-26 NOTE — Assessment & Plan Note (Addendum)
01/29/2018  Walked RA x 3 laps @ 185 ft each stopped due to  End of study, fast pace, no  desat  But sob at end   No better with no clear explanation so next step in CPST while on symbicort/ gerd rx to try to pinpoint why he feels his activity tolerance is limited but strongly suspect deconditioning/ anxiety here and not a cardiopulmonary cause.  Discussed in detail all the  indications, usual  risks and alternatives  relative to the benefits with patient/wife who agree  to proceed with w/u as outlined.     I had an extended discussion with the patient reviewing all relevant studies completed to date and  lasting 15 to 20 minutes of a 25 minute visit     device teaching / medication review extended face to face time for this visit   Each maintenance medication was reviewed in detail including most importantly the difference between maintenance and prns and under what circumstances the prns are to be triggered using an action plan format that is not reflected in the computer generated alphabetically organized AVS.    Please see AVS for specific instructions unique to this visit that I personally wrote and verbalized to the the pt in detail and then reviewed with pt  by my nurse highlighting any  changes in therapy recommended at today's visit to their plan of care.

## 2018-04-26 NOTE — Assessment & Plan Note (Signed)
Spirometry 01/29/2018  Abn effort dep portion only  - 01/29/2018  start dulera 100  2bid and d/c trelegy as this is not typical copd at all  - Sinus CT 02/09/2018 Postsurgical changes related to prior functional endoscopic sinus surgery. Moderate left maxillary sinus opacification. > referred to shoemaker > sinus ct clear as of 03/19/18 ov per Dr Wilburn Cornelia > may need reconstrucive surgery > consider refer to [facility] = Dr Cherlyn Cushing - max rx for GERD 01/29/2018  - Allergy profile 01/29/18 >  Eos 0.3 /  IgE  702  RAST pos Dust, ragweed, grass, trees , cats, dogs   - Spirometry 03/12/2018  FEV1 3.55 (90%)  Ratio 80 min curve - FENO 03/12/2018  =   30 on symb 80 2bid > add singulair   - 03/12/2018  After extensive coaching inhaler device  effectiveness =    90%    This problem has improved to his satisfaction so no change rx for now

## 2018-04-29 ENCOUNTER — Other Ambulatory Visit: Payer: Self-pay | Admitting: Internal Medicine

## 2018-05-03 ENCOUNTER — Ambulatory Visit (HOSPITAL_COMMUNITY): Payer: PPO | Attending: Internal Medicine

## 2018-05-03 ENCOUNTER — Other Ambulatory Visit (HOSPITAL_COMMUNITY): Payer: Self-pay | Admitting: *Deleted

## 2018-05-03 DIAGNOSIS — R0600 Dyspnea, unspecified: Secondary | ICD-10-CM

## 2018-05-03 DIAGNOSIS — R0609 Other forms of dyspnea: Principal | ICD-10-CM

## 2018-05-04 ENCOUNTER — Telehealth: Payer: Self-pay | Admitting: Internal Medicine

## 2018-05-04 NOTE — Progress Notes (Signed)
LMTCB

## 2018-05-04 NOTE — Telephone Encounter (Signed)
Tanda Rockers, MD  Rosana Berger, CMA        Call patient : Study showed only a very mild decrease in ex tol for age/wt but could not entirely exclude a cardiac limitation so cards w/u needs to be completed if hasn't Already done so - Be sure patient has f/u ov so we can go over all the details of this study and get a plan together moving forward - ok to move up f/u if not feeling better and wants to be seen sooner    Called and spoke to pt and relayed above results. Pt states that we will be going out town for a week and prefers to call back once in town to schedule sooner OV with MW. Pt also states that he would like to hold off on cardiology referral until after OV with MW.  Will await call back from pt to schedule f/u.   Routing to MW to make aware.

## 2018-05-19 ENCOUNTER — Ambulatory Visit (INDEPENDENT_AMBULATORY_CARE_PROVIDER_SITE_OTHER): Payer: PPO | Admitting: Internal Medicine

## 2018-05-19 ENCOUNTER — Encounter: Payer: Self-pay | Admitting: Internal Medicine

## 2018-05-19 VITALS — BP 142/88 | HR 74 | Ht 70.0 in | Wt 202.2 lb

## 2018-05-19 DIAGNOSIS — J45991 Cough variant asthma: Secondary | ICD-10-CM | POA: Diagnosis not present

## 2018-05-19 DIAGNOSIS — R0609 Other forms of dyspnea: Secondary | ICD-10-CM | POA: Diagnosis not present

## 2018-05-19 NOTE — Patient Instructions (Addendum)
Stop symbicort  - ok to resume up to 2 puffs every 12 hours if needed    Please see patient coordinator before you leave today  to schedule cardiology evaluation    Change your next appt to Please schedule a follow up visit in 3 months but call sooner if needed

## 2018-05-19 NOTE — Progress Notes (Signed)
Subjective:     Patient ID: Joshua Grant, male   DOB: 05-09-1951,    MRN: [id number]    Brief patient profile:  6 yowm quit smoking 2012 with h/o sinus problems from his late 57s requiring surgery in 1990's which helped for while with new onset sob and cough onset fall 2017 and during this same time more nasal discharge of green mucus and multiple abx did not help so referred to pulmonary clinic 01/29/2018  By Dr Morrison Old     History of Present Illness  01/29/2018 1st [facility] office visit/ Joshua Grant   Chief Complaint  Patient presents with  . Pulmonary Consult    Self referral. Pt c/o DOE for the past year. He states he gets winded with lifting things and with walking up an incline. He also c/o prod cough with green sputum for the past wk.   symptoms insidious onset/ persisten tdaily pattern: at onset of day has slept at 45 degrees and lots of am congestion green x couple tbsp seems better during the day and worse p supper / before bed does better breathe right and 45 degrees  No better with trelegy  Neb helps the most  Doe = .MMRC1 = can walk nl pace, flat grade, can't hurry or go uphills or steps s sob   rec Plan A = Automatic =  Stop trelegy and start Dulera 100 Take 2 puffs first thing in am and then another 2 puffs about 12 hours later.  Pantoprazole (protonix) 40 mg  (Prilosec 20 x 2)  Take  30-60 min before first meal of the day and Pepcid (famotidine)  20 mg one hour before bedtime until return to office - this is the best way to tell whether stomach acid is contributing to your problem.   Plan B = Backup for breathing  Only use your duoneb if you can't catch your breath.  Ok to use up to every 4 hours if needed  Plan B = Backup  For cough cough > mucinex dm up to 1200 mg every 12 hours if needed     -Sinus CT  02/09/18 > Postsurgical changes related to prior functional endoscopic sinus surgery. Moderate left maxillary sinus opacification. - Allergy profile 01/29/18 >  Eos  0.3 /  IgE  702  RAST pos Dust, ragweed, grass, trees , cats, dogs     03/12/2018  f/u ov/Joshua Grant re: sob/cough x fall 2017  Chief Complaint  Patient presents with  . Follow-up    Breathing has improved some. He is still coughing with white to green sputum.  Recently took levaquin for sinus infection.    Dyspnea:  MMRC1 = can walk nl pace, flat grade, can't hurry or go uphills or steps s sob   Cough: after wakes/ still green in am even at end of levaquin which made him achy  Sleep: fine SABA use:  Rare rec Add singulair 10 mg each pm  Continue symbicort 80 Take 2 puffs first thing in am and then another 2 puffs about 12 hours later.  Keep up with your ent evaluation Please schedule a follow up office visit in 6 weeks, call sooner if needed with all medications /inhalers/ solutions in hand so we can verify exactly what you are taking. This includes all medications from all doctors and over the counters     04/23/2018  f/u ov/Joshua Grant re:  Cough and sob on singulair/ symb 80 / gerd rx  Chief Complaint  Patient presents with  .  Follow-up    Pt states had "episode" approx 1 wk ago- increased BP and CP- went to Dr Nicki Reaper and was advised he had lung infection- given zithromax and pred and feeling much better.   Dyspnea:  MMRC1 = can walk nl pace, flat grade, can't hurry or go uphills or steps s sob   Cough: not as bad in am / tends to cough during the day  Sleep: able to sleep hoizontal on L /  SABA use:  Neb twice since last ov Still having sensation of nasal obst and plans to see [facility] ent  Overall better with cough but doe   about the same since fall 2017  Got upset about aunt burial got stressed like before x 10 years with midline severe  cp  X 6-7 hours s rad n or V   then eased   worse with deep breath and saw Dr Nicki Reaper 3 days later rx pred zpak anxiety med and 100%  cp resolved  rec Finish your prednisone / zpak Please see patient coordinator before you leave today  to schedule cpst > no  asthma. Nl vent / cardiac   05/19/2018  f/u ov/Joshua Grant re: doe/ st   Cough has resolved on symb80/ gerd rx  Chief Complaint  Patient presents with  . Follow-up    reports his breathing is about the same. CPST results.   Dyspnea:  MMRC1 = can walk nl pace, flat grade, can't hurry or go uphills or steps s sob  No cp Cough: gone since singulair/ st p supper  ? Aggravated by symbicort - even since started it Sleep:  1 pillow left side  SABA use:  None - has duoneb  No obvious day to day or daytime variability or assoc excess/ purulent sputum or mucus plugs or hemoptysis or cp or chest tightness, subjective wheeze or overt sinus or hb symptoms. No unusual exposure hx or h/o childhood pna/ asthma or knowledge of premature birth.  Sleeping  1 pillow L side   without nocturnal  or early am exacerbation  of respiratory  c/o's or need for noct saba. Also denies any obvious fluctuation of symptoms with weather or environmental changes or other aggravating or alleviating factors except as outlined above   Current Allergies, Complete Past Medical History, Past Surgical History, Family History, and Social History were reviewed in [employer] record.  ROS  The following are not active complaints unless bolded Hoarseness, sore throat, dysphagia, dental problems, itching, sneezing,  nasal congestion or discharge of excess mucus or purulent secretions, ear ache,   fever, chills, sweats, unintended wt loss or wt gain, classically pleuritic or exertional cp,  orthopnea pnd or arm/hand swelling  or leg swelling, presyncope, palpitations, abdominal pain, anorexia, nausea, vomiting, diarrhea  or change in bowel habits or change in bladder habits, change in stools or change in urine, dysuria, hematuria,  rash, arthralgias, visual complaints, headache, numbness, weakness or ataxia or problems with walking or coordination,  change in mood or  memory.        Current Meds  Medication Sig  .  ipratropium-albuterol (DUONEB) 0.5-2.5 (3) MG/3ML SOLN Take 3 mLs by nebulization every 4 (four) hours as needed.  . montelukast (SINGULAIR) 10 MG tablet Take 1 tablet (10 mg total) by mouth at bedtime.  . pantoprazole (PROTONIX) 40 MG tablet TAKE 1 TABLET (40 MG TOTAL) BY MOUTH DAILY. TAKE 30-60 MIN BEFORE FIRST MEAL OF THE DAY  .   budesonide-formoterol (SYMBICORT) 80-4.5  MCG/ACT inhaler Inhale 2 puffs into the lungs 2 (two) times daily.  .  famotidine (PEPCID) 20 MG tablet One at bedtime                          Objective:   Physical Exam    amb pleasant amb wm  05/19/2018         202  04/23/2018       203   03/12/18 203 lb 12.8 oz (92.4 kg)  01/29/18 203 lb 9.6 oz (92.4 kg)      Vital signs reviewed - Note on arrival 02 sats  98% on RA        - full dentures   HEENT: nl  turbinates bilaterally, and oropharynx. Nl external ear canals without cough reflex - full dentures   NECK :  without JVD/Nodes/TM/ nl carotid upstrokes bilaterally   LUNGS: no acc muscle use,  Nl contour chest which is clear to A and P bilaterally without cough on insp or exp maneuvers   CV:  RRR  no s3 or murmur or increase in P2, and no edema   ABD:  soft and nontender with nl inspiratory excursion in the supine position. No bruits or organomegaly appreciated, bowel sounds nl  MS:  Nl gait/ ext warm without deformities, calf tenderness, cyanosis or clubbing No obvious joint restrictions   SKIN: warm and dry without lesions    NEURO:  alert, approp, nl sensorium with  no motor or cerebellar deficits apparent.                                  Assessment:

## 2018-05-22 ENCOUNTER — Other Ambulatory Visit: Payer: Self-pay | Admitting: Internal Medicine

## 2018-05-24 ENCOUNTER — Encounter: Payer: Self-pay | Admitting: Internal Medicine

## 2018-05-24 NOTE — Assessment & Plan Note (Signed)
01/29/2018  Walked RA x 3 laps @ 185 ft each stopped due to  End of study, fast pace, no  desat  But sob at end   - CPST  04/2018 > mild impairment no ventilator limitation, could not completely rule out cardiac issue > cards eval rec 05/19/2018  - 05/19/2018 red try off symbicort as not ventilatory limited, no EIA demontrated   Pulmonary f/u prn as of 05/19/2018

## 2018-05-24 NOTE — Assessment & Plan Note (Signed)
Spirometry 01/29/2018  Abn effort dep portion only  - 01/29/2018  start dulera 100  2bid and d/c trelegy as this is not typical copd at all  - Sinus CT 02/09/2018 Postsurgical changes related to prior functional endoscopic sinus surgery. Moderate left maxillary sinus opacification. > referred to shoemaker > sinus ct clear as of 03/19/18 ov per Dr Wilburn Cornelia > may need reconstrucive surgery > consider refer to [facility] = Dr Cherlyn Cushing - max rx for GERD 01/29/2018  - Allergy profile 01/29/18 >  Eos 0.3 /  IgE  702  RAST pos Dust, ragweed, grass, trees , cats, dogs   - Spirometry 03/12/2018  FEV1 3.55 (90%)  Ratio 80 min curve - FENO 03/12/2018  =   30 on symb 80 2bid > add singulair > cough resolved   - 03/12/2018  After extensive coaching inhaler device  effectiveness =    90%  Based on cpst and response to singulair reasonable to try just singulair by itself and symbicort prn as the symbicort used regularly may be irritating his upper airway and Based on two studies from NEJM  378; 20 p 1865 (2018) and 380 : p2020-30 (2019) in pts with mild asthma it is reasonable to use low dose symbicort eg 80 2bid "prn" flare in this setting but I emphasized this was only shown with symbicort and takes advantage of the rapid onset of action but is not the same as "rescue therapy" but can be stopped once the acute symptoms have resolved and the need for rescue has been minimized (< 2 x weekly)     F/u her for this problem in 3 m   I had an extended discussion with the patient reviewing all relevant studies completed to date and  lasting 15 to 20 minutes of a 25 minute visit    Each maintenance medication was reviewed in detail including most importantly the difference between maintenance and prns and under what circumstances the prns are to be triggered using an action plan format that is not reflected in the computer generated alphabetically organized AVS.    Please see AVS for specific instructions unique to this visit that I  personally wrote and verbalized to the the pt in detail and then reviewed with pt  by my nurse highlighting any  changes in therapy recommended at today's visit to their plan of care.

## 2018-05-26 DIAGNOSIS — K635 Polyp of colon: Secondary | ICD-10-CM | POA: Diagnosis not present

## 2018-05-26 DIAGNOSIS — D122 Benign neoplasm of ascending colon: Secondary | ICD-10-CM | POA: Diagnosis not present

## 2018-05-26 DIAGNOSIS — Z86010 Personal history of colonic polyps: Secondary | ICD-10-CM | POA: Diagnosis not present

## 2018-05-26 DIAGNOSIS — D120 Benign neoplasm of cecum: Secondary | ICD-10-CM | POA: Diagnosis not present

## 2018-05-26 DIAGNOSIS — K5730 Diverticulosis of large intestine without perforation or abscess without bleeding: Secondary | ICD-10-CM | POA: Diagnosis not present

## 2018-05-26 DIAGNOSIS — Z1211 Encounter for screening for malignant neoplasm of colon: Secondary | ICD-10-CM | POA: Diagnosis not present

## 2018-06-04 ENCOUNTER — Ambulatory Visit: Payer: PPO | Admitting: Internal Medicine

## 2018-06-11 DIAGNOSIS — H524 Presbyopia: Secondary | ICD-10-CM | POA: Diagnosis not present

## 2018-06-11 DIAGNOSIS — H2512 Age-related nuclear cataract, left eye: Secondary | ICD-10-CM | POA: Diagnosis not present

## 2018-06-15 ENCOUNTER — Ambulatory Visit: Payer: PPO | Admitting: Cardiology

## 2018-06-15 ENCOUNTER — Encounter: Payer: Self-pay | Admitting: Cardiology

## 2018-06-15 VITALS — BP 148/83 | HR 72 | Ht 70.0 in | Wt 210.0 lb

## 2018-06-15 DIAGNOSIS — R0609 Other forms of dyspnea: Secondary | ICD-10-CM | POA: Diagnosis not present

## 2018-06-15 DIAGNOSIS — Z79899 Other long term (current) drug therapy: Secondary | ICD-10-CM | POA: Diagnosis not present

## 2018-06-15 DIAGNOSIS — Z8249 Family history of ischemic heart disease and other diseases of the circulatory system: Secondary | ICD-10-CM | POA: Insufficient documentation

## 2018-06-15 DIAGNOSIS — Z0389 Encounter for observation for other suspected diseases and conditions ruled out: Secondary | ICD-10-CM

## 2018-06-15 DIAGNOSIS — IMO0001 Reserved for inherently not codable concepts without codable children: Secondary | ICD-10-CM | POA: Insufficient documentation

## 2018-06-15 MED ORDER — METOPROLOL TARTRATE 50 MG PO TABS: 50.0000 mg | ORAL_TABLET | Freq: Once | ORAL | 0 refills | Status: DC

## 2018-06-15 NOTE — Assessment & Plan Note (Signed)
R/O cardiac etiology

## 2018-06-15 NOTE — Assessment & Plan Note (Signed)
By cath 1996 and 2006

## 2018-06-15 NOTE — Patient Instructions (Addendum)
Medication Instructions:  Continue current medications  If you need a refill on your cardiac medications before your next appointment, please call your pharmacy.  Labwork: BMP HERE IN OUR OFFICE AT [facility]  Take the provided lab slips with you to the lab for your blood draw.   You will NOT need to fast   Testing/Procedures: Non-Cardiac CT Angiography (CTA), is a special type of CT scan that uses a computer to produce multi-dimensional views of major blood vessels throughout the body. In CT angiography, a contrast material is injected through an IV to help visualize the blood vessels   Your physician has requested that you have an echocardiogram. Echocardiography is a painless test that uses sound waves to create images of your heart. It provides your doctor with information about the size and shape of your heart and how well your heart's chambers and valves are working. This procedure takes approximately one hour. There are no restrictions for this procedure.  Special Instructions:  Please arrive at the [facility] main entrance of [facility] at              AM (30-45 minutes prior to test start time)  [employer] [place], [place] [address] [phone]  Proceed to the [facility] Radiology Department (First Floor).  Please follow these instructions carefully (unless otherwise directed):  Hold all erectile dysfunction medications at least 48 hours prior to test.  On the Night Before the Test: . Drink plenty of water. . Do not consume any caffeinated/decaffeinated beverages or chocolate 12 hours prior to your test. . Do not take any antihistamines 12 hours prior to your test. . If you take Metformin do not take 24 hours prior to test. . If the patient has contrast allergy: ? Patient will need a prescription for Prednisone and very clear instructions (as follows): 1. Prednisone 50 mg - take 13 hours prior to test 2. Take another Prednisone  50 mg 7 hours prior to test 3. Take another Prednisone 50 mg 1 hour prior to test 4. Take Benadryl 50 mg 1 hour prior to test . Patient must complete all four doses of above prophylactic medications. . Patient will need a ride after test due to Benadryl.  On the Day of the Test: . Drink plenty of water. Do not drink any water within one hour of the test. . Do not eat any food 4 hours prior to the test. . You may take your regular medications prior to the test. . IF NOT ON A BETA BLOCKER - Take 50 mg of lopressor (metoprolol) one hour before the test. . HOLD Furosemide morning of the test.  After the Test: . Drink plenty of water. . After receiving IV contrast, you may experience a mild flushed feeling. This is normal. . On occasion, you may experience a mild rash up to 24 hours after the test. This is not dangerous. If this occurs, you can take Benadryl 25 mg and increase your fluid intake. . If you experience trouble breathing, this can be serious. If it is severe call 911 IMMEDIATELY. If it is mild, please call our office. . If you take any of these medications: Glipizide/Metformin, Avandament, Glucavance, please do not take 48 hours after completing test.   Follow-Up: Your physician wants you to follow-up in: 4-6 Weeks with Dr Gwenlyn Found.      Thank you for choosing [facility] at [facility]!!

## 2018-06-15 NOTE — Assessment & Plan Note (Signed)
MI in his 57's

## 2018-06-15 NOTE — Progress Notes (Signed)
06/15/2018 Joshua Grant   29-Nov-1951  [id number]  Primary Physician Street, Sharon Mt, MD Primary Cardiologist: Dr Gwenlyn Found (new)  HPI:  67 y/o male with a history of DOE. This has been evaluated several times in the past. In 1996 he had a cath and was told he had no significant CAD. He saw a pulmonologist in [facility] in the past. He had another cath in 2006 by Dr Bettina Gavia in [place] which again showed normal coronaries. Over the past year he has again noticed DOE. He says he gets significantly SOB when walking up his brother's driveway. He has vague epigastric discomfort but denies any chest pain, arm pain, or jaw pain. His symptoms are relieved with rest. He saw Dr Melvyn Novas and had a pulmonary work up which did not indicate significant pulmonary disease. A cardiopulmonary stress test was done 05/13/18 which showed only a very mild decrease in exercise tolerance for age/wt but could not entirely exclude a cardiac limitation. He is referred now for further evaluation. He has no history of HLD, or HLD. His father did have a heart attack in his 88's.   Current Outpatient Medications  Medication Sig Dispense Refill  . fluticasone (FLONASE) 50 MCG/ACT nasal spray Place 2 sprays into both nostrils 3 times/day as needed-between meals & bedtime for allergies or rhinitis.    Marland Kitchen montelukast (SINGULAIR) 10 MG tablet Take 1 tablet (10 mg total) by mouth at bedtime. 30 tablet 11  . pantoprazole (PROTONIX) 40 MG tablet TAKE 1 TABLET (40 MG TOTAL) BY MOUTH DAILY. TAKE 30-60 MIN BEFORE FIRST MEAL OF THE DAY 30 tablet 2   No current facility-administered medications for this visit.     Allergies  Allergen Reactions  . Amoxicillin-Pot Clavulanate Nausea And Vomiting  . Levaquin [Levofloxacin]     Joint pain    History reviewed. No pertinent past medical history.  Social History   Socioeconomic History  . Marital status: Single    Spouse name: Not on file  . Number of children: Not on file  . Years  of education: Not on file  . Highest education level: Not on file  Occupational History  . Not on file  Social Needs  . Financial resource strain: Not on file  . Food insecurity:    Worry: Not on file    Inability: Not on file  . Transportation needs:    Medical: Not on file    Non-medical: Not on file  Tobacco Use  . Smoking status: Former Smoker    Packs/day: 1.00    Years: 30.00    Pack years: 30.00    Types: Cigarettes    Last attempt to quit: 12/15/2010    Years since quitting: 7.5  . Smokeless tobacco: Never Used  Substance and Sexual Activity  . Alcohol use: Not on file  . Drug use: Not on file  . Sexual activity: Not on file  Lifestyle  . Physical activity:    Days per week: Not on file    Minutes per session: Not on file  . Stress: Not on file  Relationships  . Social connections:    Talks on phone: Not on file    Gets together: Not on file    Attends religious service: Not on file    Active member of club or organization: Not on file    Attends meetings of clubs or organizations: Not on file    Relationship status: Not on file  . Intimate partner violence:  Fear of current or ex partner: Not on file    Emotionally abused: Not on file    Physically abused: Not on file    Forced sexual activity: Not on file  Other Topics Concern  . Not on file  Social History Narrative  . Not on file     Family History  Problem Relation Age of Onset  . COPD Brother        smoker  . CAD Father        MI in his 24's     Review of Systems: General: negative for chills, fever, night sweats or weight changes.  Cardiovascular: negative for chest pain, edema, orthopnea, palpitations, paroxysmal nocturnal dyspnea  Dermatological: negative for rash Respiratory: negative for cough or wheezing Urologic: negative for hematuria Abdominal: negative for nausea, vomiting, diarrhea, bright red blood per rectum, melena, or hematemesis Neurologic: negative for visual changes,  syncope, or dizziness All other systems reviewed and are otherwise negative except as noted above.    Blood pressure (!) 148/83, pulse 72, height 5\' 10"  (1.778 m), weight 210 lb (95.3 kg).  General appearance: alert, cooperative and no distress Neck: no carotid bruit and no JVD Lungs: clear to auscultation bilaterally Heart: regular rate and rhythm Abdomen: soft, non-tender; bowel sounds normal; no masses,  no organomegaly Extremities: extremities normal, atraumatic, no cyanosis or edema Pulses: 2+ and symmetric Skin: Skin color, texture, turgor normal. No rashes or lesions Neurologic: Grossly normal  EKG NSR  ASSESSMENT AND PLAN:   Dyspnea on exertion R/O cardiac etiology  Normal coronary arteries By cath 1996 and 2006  Family history of coronary artery disease in father MI in his 20's   PLAN  Discussed with Dr Gwenlyn Found- plan coronary CTA and echo. F/U Dr Gwenlyn Found after this.   Kerin Ransom PA-C 06/15/2018 9:34 AM

## 2018-06-23 ENCOUNTER — Other Ambulatory Visit: Payer: Self-pay

## 2018-06-23 ENCOUNTER — Ambulatory Visit (HOSPITAL_COMMUNITY): Payer: PPO | Attending: Cardiology

## 2018-06-23 DIAGNOSIS — R0609 Other forms of dyspnea: Secondary | ICD-10-CM | POA: Diagnosis not present

## 2018-06-23 DIAGNOSIS — Z8249 Family history of ischemic heart disease and other diseases of the circulatory system: Secondary | ICD-10-CM | POA: Diagnosis not present

## 2018-06-23 DIAGNOSIS — Z87891 Personal history of nicotine dependence: Secondary | ICD-10-CM | POA: Insufficient documentation

## 2018-07-14 DIAGNOSIS — J441 Chronic obstructive pulmonary disease with (acute) exacerbation: Secondary | ICD-10-CM | POA: Diagnosis not present

## 2018-07-14 DIAGNOSIS — F419 Anxiety disorder, unspecified: Secondary | ICD-10-CM | POA: Diagnosis not present

## 2018-07-19 ENCOUNTER — Other Ambulatory Visit: Payer: Self-pay | Admitting: Internal Medicine

## 2018-07-19 DIAGNOSIS — Z79899 Other long term (current) drug therapy: Secondary | ICD-10-CM | POA: Diagnosis not present

## 2018-07-19 LAB — BASIC METABOLIC PANEL
BUN/Creatinine Ratio: 11 (ref 10–24)
BUN: 12 mg/dL (ref 8–27)
CO2: 21 mmol/L (ref 20–29)
Calcium: 9.5 mg/dL (ref 8.6–10.2)
Chloride: 105 mmol/L (ref 96–106)
Creatinine, Ser: 1.05 mg/dL (ref 0.76–1.27)
GFR calc Af Amer: 84 mL/min/{1.73_m2} (ref >59)
GFR calc non Af Amer: 73 mL/min/{1.73_m2} (ref >59)
Glucose: 104 mg/dL — ABNORMAL HIGH (ref 65–99)
Potassium: 4.7 mmol/L (ref 3.5–5.2)
Sodium: 138 mmol/L (ref 134–144)

## 2018-07-21 ENCOUNTER — Telehealth: Payer: Self-pay | Admitting: Cardiovascular Disease

## 2018-07-21 NOTE — Telephone Encounter (Signed)
Patient was given lab work. No questions or concerns.

## 2018-07-21 NOTE — Telephone Encounter (Signed)
New problem ° ° ° °Pt returning call concerning lab results. Please call pt. °

## 2018-07-27 ENCOUNTER — Ambulatory Visit (HOSPITAL_COMMUNITY): Admission: RE | Admit: 2018-07-27 | Payer: PPO | Source: Ambulatory Visit

## 2018-07-27 ENCOUNTER — Ambulatory Visit (HOSPITAL_COMMUNITY)
Admission: RE | Admit: 2018-07-27 | Discharge: 2018-07-27 | Disposition: A | Discharge status: Home / Self Care | Payer: PPO | Source: Ambulatory Visit | Attending: Cardiology | Admitting: Cardiology

## 2018-07-27 DIAGNOSIS — R0609 Other forms of dyspnea: Secondary | ICD-10-CM | POA: Diagnosis not present

## 2018-07-27 DIAGNOSIS — I700 Atherosclerosis of aorta: Secondary | ICD-10-CM | POA: Insufficient documentation

## 2018-07-27 DIAGNOSIS — R079 Chest pain, unspecified: Secondary | ICD-10-CM | POA: Diagnosis not present

## 2018-07-27 DIAGNOSIS — R599 Enlarged lymph nodes, unspecified: Secondary | ICD-10-CM | POA: Diagnosis not present

## 2018-07-27 DIAGNOSIS — R918 Other nonspecific abnormal finding of lung field: Secondary | ICD-10-CM | POA: Diagnosis not present

## 2018-07-27 MED ORDER — METOPROLOL TARTRATE 5 MG/5ML IV SOLN
INTRAVENOUS | Status: AC
  Filled 2018-07-27: qty 5

## 2018-07-27 MED ORDER — NITROGLYCERIN 0.4 MG SL SUBL
0.8000 mg | SUBLINGUAL_TABLET | Freq: Once | SUBLINGUAL | Status: AC
  Administered 2018-07-27: 0.8 mg via SUBLINGUAL
  Filled 2018-07-27: qty 25

## 2018-07-27 MED ORDER — NITROGLYCERIN 0.4 MG SL SUBL
SUBLINGUAL_TABLET | SUBLINGUAL | Status: AC
  Administered 2018-07-27: 0.8 mg via SUBLINGUAL
  Filled 2018-07-27: qty 2

## 2018-07-27 MED ORDER — METOPROLOL TARTRATE 5 MG/5ML IV SOLN
5.0000 mg | INTRAVENOUS | Status: AC
  Administered 2018-07-27: 5 mg via INTRAVENOUS
  Filled 2018-07-27: qty 5

## 2018-07-27 MED ORDER — IOPAMIDOL (ISOVUE-370) INJECTION 76%
100.0000 mL | Freq: Once | INTRAVENOUS | Status: AC | PRN
  Administered 2018-07-27: 100 mL via INTRAVENOUS

## 2018-08-06 ENCOUNTER — Ambulatory Visit (INDEPENDENT_AMBULATORY_CARE_PROVIDER_SITE_OTHER): Payer: PPO | Admitting: Cardiovascular Disease

## 2018-08-06 ENCOUNTER — Encounter: Payer: Self-pay | Admitting: Cardiovascular Disease

## 2018-08-06 VITALS — BP 144/76 | HR 72 | Ht 70.5 in | Wt 203.0 lb

## 2018-08-06 DIAGNOSIS — R0609 Other forms of dyspnea: Secondary | ICD-10-CM | POA: Diagnosis not present

## 2018-08-06 DIAGNOSIS — E7800 Pure hypercholesterolemia, unspecified: Secondary | ICD-10-CM

## 2018-08-06 NOTE — Assessment & Plan Note (Signed)
Joshua Grant was referred by Dr. Melvyn Novas for cardiovascular evaluation because of dyspnea on exertion.  He did have a mildly abnormal cardiopulmonary exercise test.  He is had 2 normal cardiac catheterizations in the past, in 1996 and again in 2006.  Recent 2D echo performed 06/23/2018 revealed normal LV systolic function with grade 1 diastolic dysfunction and a coronary CTA revealed a coronary calcium score of 56 with only mild stenosis in the mid circumflex.  I do not think that his symptoms are cardiovascularly mediated.

## 2018-08-06 NOTE — Patient Instructions (Signed)
Medication Instructions:   NO CHANGE  Labwork:  Your physician recommends that you return for lab work PRIOR TO EATING  Follow-Up:  Your physician wants you to follow-up in: ONE YEAR WITH DR BERRY You will receive a reminder letter in the mail two months in advance. If you don't receive a letter, please call our office to schedule the follow-up appointment.   If you need a refill on your cardiac medications before your next appointment, please call your pharmacy.    

## 2018-08-06 NOTE — Progress Notes (Signed)
08/06/2018 DENSEL Grant   January 22, 1951  [id number]  Primary Physician Street, Sharon Mt, MD Primary Cardiologist: Lorretta Harp MD Lupe Carney, [place]  HPI:  Joshua Grant is a 67 y.o. mildly overweight married Caucasian male father 2, and father 2 grandchildren who is accompanied by his wife Parke Simmers today.  He was referred by Dr. work for cardiovascular evaluation because of dyspnea.  He did see Kerin Ransom, PA-C in the office 06/15/2018 for initial evaluation.  He works [employer] pl[employer].  He has no risk factors other than a father who had a heart attack many years ago without interventional therapy.  He has smoked 40 pack years quitting 8 years ago.  He had prostate cancer and radiation therapy.  He did have a heart catheterization in 1996 and again by Dr. Bettina Gavia in 2006 which were unrevealing.  He had a recent 2D echo that showed normal LV function with grade 1 diastolic dysfunction and a coronary CTA that showed a coronary calcium score of 56 and mild disease in the circumflex.  He does get chest pain shortness of breath on exertion and has taken someone else's nitroglycerin which she says has been helpful at times.   No outpatient medications have been marked as taking for the 08/06/18 encounter (Office Visit) with Lorretta Harp, MD.     Allergies  Allergen Reactions  . Amoxicillin-Pot Clavulanate Nausea And Vomiting  . Levaquin [Levofloxacin]     Joint pain    Social History   Socioeconomic History  . Marital status: Single    Spouse name: Not on file  . Number of children: Not on file  . Years of education: Not on file  . Highest education level: Not on file  Occupational History  . Not on file  Social Needs  . Financial resource strain: Not on file  . Food insecurity:    Worry: Not on file    Inability: Not on file  . Transportation needs:    Medical: Not on file    Non-medical: Not on file  Tobacco Use  . Smoking status: Former Smoker    Packs/day:  1.00    Years: 30.00    Pack years: 30.00    Types: Cigarettes    Last attempt to quit: 12/15/2010    Years since quitting: 7.6  . Smokeless tobacco: Never Used  Substance and Sexual Activity  . Alcohol use: Not on file  . Drug use: Not on file  . Sexual activity: Not on file  Lifestyle  . Physical activity:    Days per week: Not on file    Minutes per session: Not on file  . Stress: Not on file  Relationships  . Social connections:    Talks on phone: Not on file    Gets together: Not on file    Attends religious service: Not on file    Active member of club or organization: Not on file    Attends meetings of clubs or organizations: Not on file    Relationship status: Not on file  . Intimate partner violence:    Fear of current or ex partner: Not on file    Emotionally abused: Not on file    Physically abused: Not on file    Forced sexual activity: Not on file  Other Topics Concern  . Not on file  Social History Narrative  . Not on file     Review of Systems: General: negative for chills, fever,  night sweats or weight changes.  Cardiovascular: negative for chest pain, dyspnea on exertion, edema, orthopnea, palpitations, paroxysmal nocturnal dyspnea or shortness of breath Dermatological: negative for rash Respiratory: negative for cough or wheezing Urologic: negative for hematuria Abdominal: negative for nausea, vomiting, diarrhea, bright red blood per rectum, melena, or hematemesis Neurologic: negative for visual changes, syncope, or dizziness All other systems reviewed and are otherwise negative except as noted above.    Blood pressure (!) 144/76, pulse 72, height 5' 10.5" (1.791 m), weight 203 lb (92.1 kg).  General appearance: alert and no distress Neck: no adenopathy, no carotid bruit, no JVD, supple, symmetrical, trachea midline and thyroid not enlarged, symmetric, no tenderness/mass/nodules Lungs: clear to auscultation bilaterally Heart: regular rate and rhythm,  S1, S2 normal, no murmur, click, rub or gallop Extremities: extremities normal, atraumatic, no cyanosis or edema Pulses: 2+ and symmetric Skin: Skin color, texture, turgor normal. No rashes or lesions Neurologic: Alert and oriented X 3, normal strength and tone. Normal symmetric reflexes. Normal coordination and gait  EKG not performed today.  ASSESSMENT AND PLAN:   Dyspnea on exertion [name] was referred by Dr. Melvyn Novas for cardiovascular evaluation because of dyspnea on exertion.  He did have a mildly abnormal cardiopulmonary exercise test.  He is had 2 normal cardiac catheterizations in the past, in 1996 and again in 2006.  Recent 2D echo performed 06/23/2018 revealed normal LV systolic function with grade 1 diastolic dysfunction and a coronary CTA revealed a coronary calcium score of 56 with only mild stenosis in the mid circumflex.  I do not think that his symptoms are cardiovascularly mediated.      Lorretta Harp MD FACP,FACC,FAHA, [facility] 08/06/2018 9:12 AM

## 2018-08-13 DIAGNOSIS — E7800 Pure hypercholesterolemia, unspecified: Secondary | ICD-10-CM | POA: Diagnosis not present

## 2018-08-15 LAB — LIPID PANEL
CHOL/HDL RATIO: 4.3 ratio (ref 0.0–5.0)
Cholesterol, Total: 176 mg/dL (ref 100–199)
HDL: 41 mg/dL (ref >39)
LDL Calculated: 122 mg/dL — ABNORMAL HIGH (ref 0–99)
Triglycerides: 67 mg/dL (ref 0–149)
VLDL Cholesterol Cal: 13 mg/dL (ref 5–40)

## 2018-08-15 LAB — HEPATIC FUNCTION PANEL
ALBUMIN: 4.1 g/dL (ref 3.6–4.8)
ALT: 17 IU/L (ref 0–44)
AST: 21 IU/L (ref 0–40)
Alkaline Phosphatase: 79 IU/L (ref 39–117)
BILIRUBIN TOTAL: 0.5 mg/dL (ref 0.0–1.2)
Bilirubin, Direct: 0.1 mg/dL (ref 0.00–0.40)
Total Protein: 7.5 g/dL (ref 6.0–8.5)

## 2018-08-17 ENCOUNTER — Telehealth: Payer: Self-pay | Admitting: *Deleted

## 2018-08-17 DIAGNOSIS — E7800 Pure hypercholesterolemia, unspecified: Secondary | ICD-10-CM

## 2018-08-17 NOTE — Telephone Encounter (Signed)
-----   Message from Lorretta Harp, MD sent at 08/16/2018  8:45 AM EDT ----- Not at goal for secondary prevention.  Recommend starting low-dose atorvastatin 20 mg a day and recheck lipid liver profile in 3 months.

## 2018-08-17 NOTE — Telephone Encounter (Signed)
Spoke with pt, he is not interested in taking a cholesterol medication at this time. He is going to change his diet and recheck labs in 3 months. Diet info and lab orders mailed to the patient.

## 2018-08-19 ENCOUNTER — Encounter: Payer: Self-pay | Admitting: Internal Medicine

## 2018-08-19 ENCOUNTER — Ambulatory Visit: Payer: PPO | Admitting: Internal Medicine

## 2018-08-19 VITALS — BP 130/90 | HR 77 | Ht 69.0 in | Wt 201.6 lb

## 2018-08-19 DIAGNOSIS — R0609 Other forms of dyspnea: Secondary | ICD-10-CM | POA: Diagnosis not present

## 2018-08-19 DIAGNOSIS — J310 Chronic rhinitis: Secondary | ICD-10-CM | POA: Insufficient documentation

## 2018-08-19 DIAGNOSIS — J45991 Cough variant asthma: Secondary | ICD-10-CM

## 2018-08-19 MED ORDER — CEFDINIR 300 MG PO CAPS: 300.0000 mg | ORAL_CAPSULE | Freq: Two times a day (BID) | ORAL | 0 refills | Status: DC

## 2018-08-19 NOTE — Patient Instructions (Addendum)
To get the most out of exercise, you need to be continuously aware that you are short of breath, but never out of breath, for 30 minutes daily. As you improve, it will actually be easier for you to do the same amount of exercise  in  30 minutes so always push to the level where you are short of breath as we discussed today  Omnicef 300 mg twice daily x 10 days and follow up with Dr Wilburn Cornelia  Please see patient coordinator before you leave today  to schedule allergy eval Dr Neldon Mc     If you are satisfied with your treatment plan,  let your doctor know and he/she can either refill your medications or you can return here when your prescription runs out.     If in any way you are not 100% satisfied,  please tell us.  If 100% better, tell your friends!  Pulmonary follow up is as needed

## 2018-08-19 NOTE — Progress Notes (Signed)
Subjective:     Patient ID: Joshua Grant, male   DOB: July 13, 1951,    MRN: [id number]    Brief patient profile:  18 yowm quit smoking 2012 with h/o sinus problems from his late 40s requiring surgery in 1990's which helped for while with new onset sob and cough onset fall 2017 and during this same time more nasal discharge of green mucus and multiple abx did not help so referred to pulmonary clinic 01/29/2018  By Dr Morrison Old.     History of Present Illness  01/29/2018 1st [facility] office visit/ Joshua Grant   Chief Complaint  Patient presents with  . Pulmonary Consult    Self referral. Pt c/o DOE for the past year. He states he gets winded with lifting things and with walking up an incline. He also c/o prod cough with green sputum for the past wk.   symptoms insidious onset/ persisten tdaily pattern: at onset of day has slept at 45 degrees and lots of am congestion green x couple tbsp seems better during the day and worse p supper / before bed does better breathe right and 45 degrees  No better with trelegy  Neb helps the most  Doe = .MMRC1 = can walk nl pace, flat grade, can't hurry or go uphills or steps s sob   rec Plan A = Automatic =  Stop trelegy and start Dulera 100 Take 2 puffs first thing in am and then another 2 puffs about 12 hours later.  Pantoprazole (protonix) 40 mg  (Prilosec 20 x 2)  Take  30-60 min before first meal of the day and Pepcid (famotidine)  20 mg one hour before bedtime until return to office - this is the best way to tell whether stomach acid is contributing to your problem.   Plan B = Backup for breathing  Only use your duoneb if you can't catch your breath.  Ok to use up to every 4 hours if needed  Plan B = Backup  For cough cough > mucinex dm up to 1200 mg every 12 hours if needed     -Sinus CT  02/09/18 > Postsurgical changes related to prior functional endoscopic sinus surgery. Moderate left maxillary sinus opacification. - Allergy profile 01/29/18 >  Eos  0.3 /  IgE  702  RAST pos Dust, ragweed, grass, trees , cats, dogs     03/12/2018  f/u ov/Joshua Grant re: sob/cough x fall 2017  Chief Complaint  Patient presents with  . Follow-up    Breathing has improved some. He is still coughing with white to green sputum.  Recently took levaquin for sinus infection.    Dyspnea:  MMRC1 = can walk nl pace, flat grade, can't hurry or go uphills or steps s sob   Cough: after wakes/ still green in am even at end of levaquin which made him achy  Sleep: fine SABA use:  Rare rec Add singulair 10 mg each pm  Continue symbicort 80 Take 2 puffs first thing in am and then another 2 puffs about 12 hours later.  Keep up with your ent evaluation Please schedule a follow up office visit in 6 weeks, call sooner if needed with all medications /inhalers/ solutions in hand so we can verify exactly what you are taking. This includes all medications from all doctors and over the counters     04/23/2018  f/u ov/Joshua Grant re:  Cough and sob on singulair/ symb 80 / gerd rx  Chief Complaint  Patient presents with  .  Follow-up    Pt states had "episode" approx 1 wk ago- increased BP and CP- went to Dr Nicki Reaper and was advised he had lung infection- given zithromax and pred and feeling much better.   Dyspnea:  MMRC1 = can walk nl pace, flat grade, can't hurry or go uphills or steps s sob   Cough: not as bad in am / tends to cough during the day  Sleep: able to sleep hoizontal on L /  SABA use:  Neb twice since last ov Still having sensation of nasal obst and plans to see [facility] ent  Overall better with cough but doe   about the same since fall 2017  Got upset about aunt burial got stressed like before x 10 years with midline severe  cp  X 6-7 hours s rad n or V   then eased   worse with deep breath and saw Dr Nicki Reaper 3 days later rx pred zpak anxiety med and 100%  cp resolved  rec Finish your prednisone / zpak Please see patient coordinator before you leave today  to schedule cpst > no  asthma. Nl vent / cardiac   05/19/2018  f/u ov/Joshua Grant re: doe/ st   Cough has resolved on symb80/ gerd rx  Chief Complaint  Patient presents with  . Follow-up    reports his breathing is about the same. CPST results.   Dyspnea:  MMRC1 = can walk nl pace, flat grade, can't hurry or go uphills or steps s sob  No cp Cough: gone since singulair/ st p supper  ? Aggravated by symbicort - even since started it Sleep:  1 pillow left side  SABA use:  None - has duoneb rec Stop symbicort  - ok to resume up to 2 puffs every 12 hours if needed  Please see patient coordinator before you leave today  to schedule cardiology evaluation > see note Dr Gwenlyn Found 08/06/18 : no further w/u needed, not felt to have cardiac limitation      08/19/2018  f/u ov/Joshua Grant re: cough c/w uacs in pt with recurrent sinusitis but no evidence of asthma Chief Complaint  Patient presents with  . Follow-up    Breathing is unchanged. He has a prod cough with green to white sputum.    Dyspnea:  MMRC1 = can walk nl pace, flat grade, can't hurry or go uphills or steps s sob > not variable at all   Cough: recurred  sev weeks prior to OV  in setting of nasal congestion / has seen [facility] rec possible surgery  Sleeping: flat  SABA use: none 02: none    No obvious pattern to cough in terms of day to day or daytime variability or assoc   mucus plugs or hemoptysis or cp or chest tightness, subjective wheeze or overt sinus or hb symptoms.   Sleeping as above without nocturnal  or early am exacerbation  of respiratory  c/o's or need for noct saba. Also denies any obvious fluctuation of symptoms with weather or environmental changes or other aggravating or alleviating factors except as outlined above   No unusual exposure hx or h/o childhood pna/ asthma or knowledge of premature birth.  Current Allergies, Complete Past Medical History, Past Surgical History, Family History, and Social History were reviewed in [employer]  record.  ROS  The following are not active complaints unless bolded Hoarseness, sore throat, dysphagia, dental problems, itching, sneezing,  nasal congestion or discharge of excess mucus or purulent secretions, ear  ache,   fever, chills, sweats, unintended wt loss or wt gain, classically pleuritic or exertional cp,  orthopnea pnd or arm/hand swelling  or leg swelling, presyncope, palpitations, abdominal pain, anorexia, nausea, vomiting, diarrhea  or change in bowel habits or change in bladder habits, change in stools or change in urine, dysuria, hematuria,  rash, arthralgias, visual complaints, headache, numbness, weakness or ataxia or problems with walking or coordination,  change in mood or  memory.        Current Meds  Medication Sig  . fluticasone (FLONASE) 50 MCG/ACT nasal spray Place 2 sprays into both nostrils 3 times/day as needed-between meals & bedtime for allergies or rhinitis.  Marland Kitchen montelukast (SINGULAIR) 10 MG tablet Take 1 tablet (10 mg total) by mouth at bedtime.  . pantoprazole (PROTONIX) 40 MG tablet TAKE 1 TABLET (40 MG TOTAL) BY MOUTH DAILY. TAKE 30-60 MIN BEFORE FIRST MEAL OF THE DAY                      Objective:   Physical Exam    mild obese wm nad  08/19/2018         201  05/19/2018         202  04/23/2018       203   03/12/18 203 lb 12.8 oz (92.4 kg)  01/29/18 203 lb 9.6 oz (92.4 kg)       Vital signs reviewed - Note on arrival 02 sats  91% on RA   HEENT: nl dentition,  and oropharynx. Nl external ear canals without cough reflex - mild bilateral non-specific turbinate edema     NECK :  without JVD/Nodes/TM/ nl carotid upstrokes bilaterally   LUNGS: no acc muscle use,  Nl contour chest which is clear to A and P bilaterally without cough on insp or exp maneuvers   CV:  RRR  no s3 or murmur or increase in P2, and no edema   ABD:  soft and nontender with nl inspiratory excursion in the supine position. No bruits or organomegaly appreciated, bowel sounds  nl  MS:  Nl gait/ ext warm without deformities, calf tenderness, cyanosis or clubbing No obvious joint restrictions   SKIN: warm and dry without lesions    NEURO:  alert, approp, nl sensorium with  no motor or cerebellar deficits apparent.                     Assessment:

## 2018-08-20 ENCOUNTER — Encounter: Payer: Self-pay | Admitting: Internal Medicine

## 2018-08-20 NOTE — Assessment & Plan Note (Signed)
01/29/2018  Walked RA x 3 laps @ 185 ft each stopped due to  End of study, fast pace, no  desat  But sob at end  - CPST  04/2018 > mild impairment no ventilator limitation, could not completely rule out cardiac issue > cards eval rec 05/19/2018 > no cardiac problem identified by Dr Gwenlyn Found as of 08/06/18 - 05/19/2018 rec  try off symbicort as not ventilatory limited, no EIA demontrated > no worse as of 08/19/2018 > Spirometry 08/19/2018  FEV1 3.2 (97%)  Ratio 82  Off all rx    I had an extended final summary discussion with the patient reviewing all relevant studies completed to date and  lasting 15 to 20 minutes of a 25 minute visit on the following issues:    No evidence of any limiting pulmonary or cardiac issues, only deconditinoing   Reconditioning reviewed in detail/ pulmonary f/u is prn

## 2018-08-20 NOTE — Assessment & Plan Note (Signed)
Spirometry 01/29/2018  Abn effort dep portion only  - 01/29/2018  start dulera 100  2bid and d/c trelegy as this is not typical copd at all  - Sinus CT 02/09/2018 Postsurgical changes related to prior functional endoscopic sinus surgery. Moderate left maxillary sinus opacification. > referred to [facility] > sinus ct clear as of 03/19/18 ov per Dr Wilburn Cornelia > may need reconstrucive surgery > consider refer to [facility] = Dr Cherlyn Cushing - max rx for GERD 01/29/2018  - Allergy profile 01/29/18 >  Eos 0.3 /  IgE  702  RAST pos Dust, ragweed, grass, trees , cats, dogs  - Spirometry 03/12/2018  FEV1 3.55 (90%)  Ratio 80 min curve  - FENO 03/12/2018  =   30 on symb 80 2bid > add singulair > cough resolved  But recurred  Late aug 2019 assoc with obvious nasal congestion > rec omnicef x 10 days and refer to allergy for second opinion as to how to manage recurrent sinusitis

## 2018-08-20 NOTE — Assessment & Plan Note (Signed)
Referred to [facility] 08/19/2018 in [place] office

## 2018-08-27 ENCOUNTER — Other Ambulatory Visit: Payer: Self-pay | Admitting: Internal Medicine

## 2018-09-13 ENCOUNTER — Ambulatory Visit (INDEPENDENT_AMBULATORY_CARE_PROVIDER_SITE_OTHER): Payer: PPO | Admitting: Allergy and Immunology

## 2018-09-13 ENCOUNTER — Encounter: Payer: Self-pay | Admitting: Allergy and Immunology

## 2018-09-13 VITALS — BP 158/100 | HR 71 | Temp 97.5°F | Resp 18 | Ht 69.0 in | Wt 199.4 lb

## 2018-09-13 DIAGNOSIS — T485X1A Poisoning by other anti-common-cold drugs, accidental (unintentional), initial encounter: Secondary | ICD-10-CM | POA: Diagnosis not present

## 2018-09-13 DIAGNOSIS — J3089 Other allergic rhinitis: Secondary | ICD-10-CM

## 2018-09-13 DIAGNOSIS — I10 Essential (primary) hypertension: Secondary | ICD-10-CM | POA: Diagnosis not present

## 2018-09-13 DIAGNOSIS — J4530 Mild persistent asthma, uncomplicated: Secondary | ICD-10-CM

## 2018-09-13 DIAGNOSIS — J310 Chronic rhinitis: Secondary | ICD-10-CM | POA: Diagnosis not present

## 2018-09-13 DIAGNOSIS — T485X5A Adverse effect of other anti-common-cold drugs, initial encounter: Secondary | ICD-10-CM

## 2018-09-13 MED ORDER — AZELASTINE-FLUTICASONE 137-50 MCG/ACT NA SUSP: 2.0000 | Freq: Two times a day (BID) | NASAL | 1 refills | Status: DC

## 2018-09-13 MED ORDER — LORATADINE 10 MG PO TABS: ORAL_TABLET | ORAL | 1 refills | Status: DC

## 2018-09-13 MED ORDER — MOMETASONE FUROATE 220 MCG/INH IN AEPB: 1.0000 | INHALATION_SPRAY | Freq: Every day | RESPIRATORY_TRACT | 1 refills | Status: DC

## 2018-09-13 NOTE — Progress Notes (Signed)
Dear Dr. Melvyn Novas,  Thank you for referring Joshua Grant to the [facility] on 09/13/2018.   Below is a summation of this patient's evaluation and recommendations.  Thank you for your referral. I will keep you informed about this patient's response to treatment.   If you have any questions please do not hesitate to contact me.   Sincerely,  Jiles Prows, MD Allergy / Immunology [place]   ______________________________________________________________________    NEW PATIENT NOTE  Referring Provider: Tanda Rockers, MD Primary Provider: Street, Sharon Mt, MD Date of office visit: 09/13/2018    Subjective:   Chief Complaint:  Joshua Grant (DOB: 1951-10-07) is a 67 y.o. male who presents to the clinic on 09/13/2018 with a chief complaint of Establish Care and Shortness of Breath .     HPI: [name] presents to this clinic in evaluation of breathing problems.  Arthuro states that for over 10 years he has been having lots of problems whenever he exerts himself.  He gets intensely short of breath and develops air hunger if he lifts anything above 25 pounds or if he walks up a hill or if he gets emotionally distressed.  He does not really have any coughing or wheezing.  He has apparently had thorough evaluation of both his heart and lungs including 2 cardiac catheterizations which have not identified any significant abnormality.  Apparently he has been able to perform a exercise treadmill test.  Whenever he develops this breathing scenario he will take a nasal inhaler, which sounds like Benzedrine, and he resolves this issue within about 2 or 3 minutes.  He has tried various inhalers which have not helped him at all.  He did smoke for decades and discontinued this hobby in 2012.  Virgel also has lots of sneezing and nasal congestion especially if he is exposed to indoor dust or barn dust.   He has been using nasal steroids which helped somewhat.  He has apparently had blood test which identified allergic disease.  Apparently he had some form of sinus surgery back in the 1990s.  [name] also has an issue with reflux that he believes is well controlled on a proton pump inhibitor.  Jahmani also has a history of developing uvula swelling on 3 occasions over the course of 4 years.  Usually this episode will last less than 12 hours and is treated with Benadryl.  He has no associated systemic or constitutional symptoms.  He may have been using some form of blood pressure medicine when this issue has occurred.  His last episode was over a year ago.  Past Medical History:  Diagnosis Date  . Angio-edema   . Asthma     History reviewed. No pertinent surgical history.  Allergies as of 09/13/2018      Reactions   Amoxicillin-pot Clavulanate Nausea And Vomiting   Levaquin [levofloxacin]    Joint pain      Medication List      fluticasone 50 MCG/ACT nasal spray Commonly known as:  FLONASE Place 2 sprays into both nostrils 3 times/day as needed-between meals & bedtime for allergies or rhinitis.   montelukast 10 MG tablet Commonly known as:  SINGULAIR Take 1 tablet (10 mg total) by mouth at bedtime.   pantoprazole 40 MG tablet Commonly known as:  PROTONIX TAKE 1 TABLET (40 MG TOTAL) BY MOUTH DAILY. TAKE 30-60 MIN BEFORE FIRST MEAL OF  THE DAY       Review of systems negative except as noted in HPI / PMHx or noted below:  Review of Systems  Constitutional: Negative.   HENT: Negative.   Eyes: Negative.   Respiratory: Negative.   Cardiovascular: Negative.   Gastrointestinal: Negative.   Genitourinary: Negative.   Musculoskeletal: Negative.   Skin: Negative.   Neurological: Negative.   Endo/Heme/Allergies: Negative.   Psychiatric/Behavioral: Negative.     Family History  Problem Relation Age of Onset  . COPD Brother        smoker  . CAD Father        MI in his 32's      Social History   Socioeconomic History  . Marital status: Married    Spouse name: Not on file  . Number of children: Not on file  . Years of education: Not on file  . Highest education level: Not on file  Occupational History  . Not on file  Social Needs  . Financial resource strain: Not on file  . Food insecurity:    Worry: Not on file    Inability: Not on file  . Transportation needs:    Medical: Not on file    Non-medical: Not on file  Tobacco Use  . Smoking status: Former Smoker    Packs/day: 1.00    Years: 30.00    Pack years: 30.00    Types: Cigarettes    Last attempt to quit: 12/15/2010    Years since quitting: 7.7  . Smokeless tobacco: Never Used  Substance and Sexual Activity  . Alcohol use: Yes    Comment: 1 a day  . Drug use: Not Currently  . Sexual activity: Not on file  Lifestyle  . Physical activity:    Days per week: Not on file    Minutes per session: Not on file  . Stress: Not on file  Relationships  . Social connections:    Talks on phone: Not on file    Gets together: Not on file    Attends religious service: Not on file    Active member of club or organization: Not on file    Attends meetings of clubs or organizations: Not on file    Relationship status: Not on file  . Intimate partner violence:    Fear of current or ex partner: Not on file    Emotionally abused: Not on file    Physically abused: Not on file    Forced sexual activity: Not on file  Other Topics Concern  . Not on file  Social History Narrative  . Not on file    Environmental and Social history  Lives in a house with a dry environment, a dog and cat located inside the household, no carpet in the bedroom, plastic on the bed, plastic on the pillow, and no smokers located inside the household.  Objective:   Vitals:   09/13/18 1007 09/13/18 1135  BP: (!) 162/100 (!) 158/100  Pulse: 71   Resp: 18   Temp: (!) 97.5 F (36.4 C)   SpO2: 97%    Height: 5\' 9"  (175.3  cm) Weight: 199 lb 6.4 oz (90.4 kg)  Physical Exam  Constitutional:  Nasal crease  HENT:  Head: Normocephalic. Head is without right periorbital erythema and without left periorbital erythema.  Right Ear: Tympanic membrane, external ear and ear canal normal.  Left Ear: Tympanic membrane, external ear and ear canal normal.  Nose: Nose normal. No mucosal edema or  rhinorrhea.  Mouth/Throat: Oropharynx is clear and moist and mucous membranes are normal. No oropharyngeal exudate.  Eyes: Pupils are equal, round, and reactive to light. Conjunctivae and lids are normal.  Neck: Trachea normal. No tracheal deviation present. No thyromegaly present.  Cardiovascular: Normal rate, regular rhythm, S1 normal, S2 normal and normal heart sounds.  No murmur heard. Pulmonary/Chest: Effort normal. No stridor. No respiratory distress. He has no wheezes. He has no rales. He exhibits no tenderness.  Abdominal: Soft. He exhibits no distension and no mass. There is no hepatosplenomegaly. There is no tenderness. There is no rebound and no guarding.  Musculoskeletal: He exhibits no edema or tenderness.  Lymphadenopathy:       Head (right side): No tonsillar adenopathy present.       Head (left side): No tonsillar adenopathy present.    He has no cervical adenopathy.    He has no axillary adenopathy.  Neurological: He is alert.  Skin: No rash noted. He is not diaphoretic. No erythema. No pallor. Nails show no clubbing.    Diagnostics: Allergy skin tests were performed.  He demonstrated hypersensitivity to house dust mite, dog, and mold.  Spirometry was performed and demonstrated an FEV1 of 3.11 @ 96 % of predicted. FEV1/FVC = 0.76.  Following administration of nebulized albuterol his FEV1 did not change significantly   Results of a sinus CT scan obtained 08 February 2018 identified the following:  Prior bilateral maxillary antrostomies and uncinectomies. There is near complete opacification of the left  maxillary sinus. Right maxillary sinus is clear. Sphenoid and ethmoid sinuses are clear. No frontal sinus abnormality.  Results of a chest x-ray obtained 23 Apr 2018 identified the following:  There is chronic bilateral diffuse chronic mild interstitial thickening. There is no focal parenchymal opacity. There is no pleural effusion or pneumothorax. The heart and mediastinal contours are unremarkable.  Results of an echocardiogram obtained 23 June 2018 identified the following:  - Left ventricle: The cavity size was normal. Wall thickness was   normal. Systolic function was normal. The estimated ejection   fraction was in the range of 55% to 60%. Wall motion was normal;   there were no regional wall motion abnormalities. Doppler   parameters are consistent with abnormal left ventricular   relaxation (grade 1 diastolic dysfunction). - Mitral valve: Calcified annulus. - Right ventricle: The cavity size was mildly dilated. - Right atrium: The atrium was mildly dilated.  Results of blood tests obtained 29 January 2018 identified WBC 8.7, absolute eosinophil 300, absolute lymphocyte 1800, hemoglobin 15.5, platelet 375, IgE 702 KU/L, IgE antibodies directed against multiple aeroallergens including dust mite, cat, dog, pollens, cockroach, and molds.   Assessment and Plan:    1. Not well controlled mild persistent asthma   2. Perennial allergic rhinitis   3. Rhinitis medicamentosa   4. Essential hypertension      1.  Allergen avoidance measures  2.  Discontinue over-the-counter nasal decongestant inhaler  3.  Treat and prevent inflammation:   A.  Dymista -1 spray each nostril twice a day  B.  Montelukast 10 mg -1 tablet 1 time per day  C.  Asmanex 220 -1 inhalation 1 time per day  4.  If needed:   A.  Nasal saline  B.  Loratadine 10 mg -1-2 tablets 1 time per day  5.  Initiate a slow progressive exercise routine aiming for 30 - 60 minutes a day  6.  Return to clinic in 4  weeks or earlier  if problem  7. BP = 158/100. Treatment? Check BP  Isaak does appear to have atopic disease that most likely giving rise to some degree of inflammation of both his upper and lower airways and we will get him to perform allergen avoidance measures and consistently use anti-inflammatory medications for his entire airway as noted above.  I also asked him to discontinue his over-the-counter nasal decongestant inhaler and to undergo a slow progressive exercise routine as I suspect that he does have some degree of cardiac deconditioning.  In addition, he has hypertension and he will check his blood pressure at home to see if the readings today are a trend that will require further evaluation and treatment.  I will see him back in this clinic in 4 weeks or earlier if there is a problem.  Jiles Prows, MD Allergy / Immunology [facility]

## 2018-09-13 NOTE — Patient Instructions (Addendum)
  1.  Allergen avoidance measures  2.  Discontinue over-the-counter nasal decongestant inhaler  3.  Treat and prevent inflammation:   A.  Dymista -1 spray each nostril twice a day  B.  Montelukast 10 mg -1 tablet 1 time per day  C.  Asmanex 220 -1 inhalation 1 time per day  4.  If needed:   A.  Nasal saline  B.  Loratadine 10 mg -1-2 tablets 1 time per day  5.  Initiate a slow progressive exercise routine aiming for 30 - 60 minutes a day  6.  Return to clinic in 4 weeks or earlier if problem  7. BP = 158/100. Treatment? Check BP

## 2018-09-14 ENCOUNTER — Encounter: Payer: Self-pay | Admitting: Allergy and Immunology

## 2018-09-15 ENCOUNTER — Telehealth: Payer: Self-pay | Admitting: Allergy and Immunology

## 2018-09-15 NOTE — Telephone Encounter (Signed)
Please inform patient that his blood pressure had was elevated before he started Dymista.  His blood pressure issue appears to be an issue that will need further evaluation with his primary care doctor.  I doubt that this is solely secondary to [place].  Once he gets his blood pressure under better control with his primary care doctor he can restart Dymista.

## 2018-09-15 NOTE — Telephone Encounter (Signed)
Jakhai called in and states when he tried out DYMISTA, it caused his blood pressure to rise.  He states after taking it his blood pressure was 153/107.  Oisin states he stopped taking the [facility] but still take [place].  Please advise.

## 2018-09-17 NOTE — Telephone Encounter (Signed)
Left a message for patient to call office

## 2018-09-20 NOTE — Telephone Encounter (Signed)
Patient informed. He has an appt with his PCP tomorrow. He is using his Flonase right now, but I told him to restart his Dymista and stop his Flonase after his PCP gets his BP back under control.

## 2018-09-21 DIAGNOSIS — Z2821 Immunization not carried out because of patient refusal: Secondary | ICD-10-CM | POA: Diagnosis not present

## 2018-09-21 DIAGNOSIS — R5383 Other fatigue: Secondary | ICD-10-CM | POA: Diagnosis not present

## 2018-09-21 DIAGNOSIS — Z6828 Body mass index (BMI) 28.0-28.9, adult: Secondary | ICD-10-CM | POA: Diagnosis not present

## 2018-09-21 DIAGNOSIS — I10 Essential (primary) hypertension: Secondary | ICD-10-CM | POA: Diagnosis not present

## 2018-10-04 ENCOUNTER — Other Ambulatory Visit: Payer: Self-pay

## 2018-10-04 ENCOUNTER — Telehealth: Payer: Self-pay | Admitting: Internal Medicine

## 2018-10-04 ENCOUNTER — Telehealth: Payer: Self-pay | Admitting: Allergy and Immunology

## 2018-10-04 MED ORDER — MOMETASONE FUROATE 220 MCG/INH IN AEPB: 1.0000 | INHALATION_SPRAY | Freq: Every day | RESPIRATORY_TRACT | 1 refills | Status: DC

## 2018-10-04 MED ORDER — AZELASTINE-FLUTICASONE 137-50 MCG/ACT NA SUSP: 2.0000 | Freq: Two times a day (BID) | NASAL | 1 refills | Status: DC

## 2018-10-04 MED ORDER — LORATADINE 10 MG PO TABS: ORAL_TABLET | ORAL | 1 refills | Status: DC

## 2018-10-04 NOTE — Telephone Encounter (Signed)
Pt is calling back [phone]

## 2018-10-04 NOTE — Telephone Encounter (Signed)
Rx sent 

## 2018-10-04 NOTE — Telephone Encounter (Signed)
Pt is calling back [phone]

## 2018-10-04 NOTE — Telephone Encounter (Signed)
LMTCB x2  

## 2018-10-04 NOTE — Telephone Encounter (Signed)
Spoke with patient. He stated that he does not need a refill at this time and he is aware to call his PCP for refills. Nothing further needed at time of call.

## 2018-10-04 NOTE — Telephone Encounter (Signed)
[facility] called and requested a refill on DYMISTA, CLARITIN, and ASMANEX be sent to them for Joshua Grant.  They state [name] no longer uses [facility] and is now using them.

## 2018-10-04 NOTE — Telephone Encounter (Signed)
Instructions from pt's last OV with MW 08/19/18  If you are satisfied with your treatment plan,  let your doctor know and he/she can either refill your medications or you can return here when your prescription runs out.     If in any way you are not 100% satisfied,  please tell us.  If 100% better, tell your friends!  Pulmonary follow up is as needed       Called pt's pharmacy and spoke with Arbie Cookey and stated to her that we had received the call from [facility]. I stated that at pt's last OV, due to pt following up with [place] as needed, refills of meds can come from pt's PCP.  Attempted to call pt to state the above to him in regards to med refills but unable to reach him. Left message for pt to return call.

## 2018-10-09 ENCOUNTER — Other Ambulatory Visit: Payer: Self-pay | Admitting: Allergy and Immunology

## 2018-10-12 ENCOUNTER — Telehealth: Payer: Self-pay | Admitting: Allergy and Immunology

## 2018-10-12 MED ORDER — AZELASTINE HCL 0.1 % NA SOLN: 1.0000 | Freq: Two times a day (BID) | NASAL | 5 refills | Status: DC

## 2018-10-12 NOTE — Telephone Encounter (Signed)
Joshua Grant called in and states his insurance will not cover [place].  Elenore Rota stated his PCP prescribed FLONASE.  Larene Beach stated we could call in AZELASTINE and he would like that sent to [facility] in [place].

## 2018-10-12 NOTE — Telephone Encounter (Signed)
Rx sent 

## 2018-10-13 ENCOUNTER — Ambulatory Visit (INDEPENDENT_AMBULATORY_CARE_PROVIDER_SITE_OTHER): Payer: PPO | Admitting: Allergy and Immunology

## 2018-10-13 ENCOUNTER — Encounter: Payer: Self-pay | Admitting: Allergy and Immunology

## 2018-10-13 VITALS — BP 132/94 | HR 64 | Resp 16

## 2018-10-13 DIAGNOSIS — I10 Essential (primary) hypertension: Secondary | ICD-10-CM | POA: Diagnosis not present

## 2018-10-13 DIAGNOSIS — J3089 Other allergic rhinitis: Secondary | ICD-10-CM | POA: Diagnosis not present

## 2018-10-13 DIAGNOSIS — J4530 Mild persistent asthma, uncomplicated: Secondary | ICD-10-CM | POA: Diagnosis not present

## 2018-10-13 MED ORDER — ALBUTEROL SULFATE HFA 108 (90 BASE) MCG/ACT IN AERS: INHALATION_SPRAY | RESPIRATORY_TRACT | 1 refills | Status: DC

## 2018-10-13 NOTE — Patient Instructions (Addendum)
  1.  Treat and prevent inflammation:   A.  Fluticasone -1 spray each nostril twice a day  B.  Montelukast 10 mg -1 tablet 1 time per day  2.  If needed:   A.  Nasal saline  B.  Loratadine 10 mg -1-2 tablets 1 time per day  C.  Proair HFA or similar - 2 inhalations every 4-6 hours or prior to exercise  3.  Initiate a slow progressive exercise routine aiming for 30 - 60 minutes a day  4. Address blood pressure issue with primary care doctor  5. Return to clinic in 6 months or earlier if problem

## 2018-10-13 NOTE — Progress Notes (Signed)
Follow-up Note  Referring Provider: Street, Sharon Grant, * Primary Provider: Street, Sharon Mt, MD Date of Office Visit: 10/13/2018  Subjective:   Joshua Grant (DOB: May 21, 1951) is a 67 y.o. male who returns to the [facility] on 10/13/2018 in re-evaluation of the following:  HPI: Joshua Grant returns to this clinic in evaluation of his breathing problems addressed during his initial evaluation of 13 September 2018.  He is the same.  He does not have any resting shortness of breath but gets dyspnea on exertion.  He does not believe that the therapy that he had administered during his last visit has helped him at all.  He has already been administered multiple inhaler agents in the past which have not helped him and he does not think that any medication that has been administered in the past several years has helped him to any degree.  He did not initiate a progressive aerobic exercise program as suggested during his last visit.  His nose is doing well on his current therapy which includes nasal steroids and montelukast.  He has had no issues with reflux.  He has recently started a low-dose antihypertensive medication using losartan at 25 mg once a day.  Since starting this agent he tells me that he has had several hypotensive episodes with blood pressure systolic at 70 and just feeling bad in general during these episodes.  Allergies as of 10/13/2018      Reactions   Amoxicillin-pot Clavulanate Nausea And Vomiting   Levaquin [levofloxacin]    Joint pain      Medication List      azelastine 0.1 % nasal spray Commonly known as:  ASTELIN Place 1 spray into both nostrils 2 (two) times daily. USE ALONG WITH FLONASE   fluticasone 50 MCG/ACT nasal spray Commonly known as:  FLONASE Place 2 sprays into both nostrils 3 times/day as needed-between meals & bedtime for allergies or rhinitis.   loratadine 10 MG tablet Commonly known as:  CLARITIN Take 1-2 tablets 1  time per day as needed.   losartan 25 MG tablet Commonly known as:  COZAAR Take 25 mg by mouth daily. for high blood pressure   mometasone 220 MCG/INH inhaler Commonly known as:  ASMANEX Inhale 1 puff into the lungs daily.   montelukast 10 MG tablet Commonly known as:  SINGULAIR Take 1 tablet (10 mg total) by mouth at bedtime.   pantoprazole 40 MG tablet Commonly known as:  PROTONIX TAKE 1 TABLET (40 MG TOTAL) BY MOUTH DAILY. TAKE 30-60 MIN BEFORE FIRST MEAL OF THE DAY       Past Medical History:  Diagnosis Date  . Angio-edema   . Asthma   . High blood pressure     History reviewed. No pertinent surgical history.  Review of systems negative except as noted in HPI / PMHx or noted below:  Review of Systems  Constitutional: Negative.   HENT: Negative.   Eyes: Negative.   Respiratory: Negative.   Cardiovascular: Negative.   Gastrointestinal: Negative.   Genitourinary: Negative.   Musculoskeletal: Negative.   Skin: Negative.   Neurological: Negative.   Endo/Heme/Allergies: Negative.   Psychiatric/Behavioral: Negative.      Objective:   Vitals:   10/13/18 1155  BP: (!) 132/94  Pulse: 64  Resp: 16          Physical Exam  HENT:  Head: Normocephalic.  Right Ear: External ear and ear canal normal. Tympanic membrane is scarred.  Left Ear:  External ear and ear canal normal. Tympanic membrane is scarred.  Nose: Nose normal. No mucosal edema or rhinorrhea.  Mouth/Throat: Uvula is midline, oropharynx is clear and moist and mucous membranes are normal. No oropharyngeal exudate.  Eyes: Conjunctivae are normal.  Neck: Trachea normal. No tracheal tenderness present. No tracheal deviation present. No thyromegaly present.  Cardiovascular: Normal rate, regular rhythm, S1 normal, S2 normal and normal heart sounds.  No murmur heard. Pulmonary/Chest: Breath sounds normal. No stridor. No respiratory distress. He has no wheezes. He has no rales.  Musculoskeletal: He  exhibits no edema.  Lymphadenopathy:       Head (right side): No tonsillar adenopathy present.       Head (left side): No tonsillar adenopathy present.    He has no cervical adenopathy.  Neurological: He is alert.  Skin: No rash noted. He is not diaphoretic. No erythema. Nails show no clubbing.    Diagnostics:    Spirometry was performed and demonstrated an FEV1 of 3.06 at 95 % of predicted.  Results of a chest CT scan obtained 27 July 2018 identified the following:  1. No acute extracardiac abnormality within the imaged chest. 2. Subpleural interstitial opacities within both lungs, mild. Relatively similar to 08/07/2016. Considerations include post infectious or inflammatory scarring versus mild nonspecific interstitial pneumonitis. 3. Mild thoracic adenopathy, favored to be reactive. Nodal size slightly increased compared to 2017.  Assessment and Plan:   1. Not well controlled mild persistent asthma   2. Perennial allergic rhinitis   3. Essential hypertension      1.  Treat and prevent inflammation:   A.  Fluticasone -1 spray each nostril twice a day  B.  Montelukast 10 mg -1 tablet 1 time per day  2.  If needed:   A.  Nasal saline  B.  Loratadine 10 mg -1-2 tablets 1 time per day  C.  Proair HFA or similar - 2 inhalations every 4-6 hours or prior to exercise  3.  Initiate a slow progressive exercise routine aiming for 30 - 60 minutes a day  4. Address blood pressure issue with primary care doctor  5. Return to clinic in 6 months or earlier if problem  [name] has really had no significant improvement from any therapy administered for his dyspnea on exertion and he has had extensive evaluation regarding this issue in the past.  Evidence does point to eosinophilic driven inflammation of his airway but unfortunately he just has not received any significant benefit and is not very interested in utilizing any therapy for this issue at this point.  I am not going to have  him use any type of anti-inflammatory agent for his airway at this point and he has the option of utilizing a short acting bronchodilator if needed or prior to exercise.  I think the one issue that may help him in the long run is if he could enroll in a progressive exercise program and I have asked he and his wife to do so at some point in the near future.  His nose appears to be doing quite well on his current plan and he can continue on fluticasone and montelukast.  I will see him back in this clinic in 6 months or earlier if there is a problem.  He does have some degree of blood pressure instability and I have asked him to address this issue with his primary care doctor.  I did ask him to use half his current dose of losartan for  the next week and report to his primary care doctor how he is doing with that lower dosage.  Allena Katz, MD Allergy / Immunology [place]

## 2018-10-14 ENCOUNTER — Encounter: Payer: Self-pay | Admitting: Allergy and Immunology

## 2018-10-14 DIAGNOSIS — K219 Gastro-esophageal reflux disease without esophagitis: Secondary | ICD-10-CM | POA: Diagnosis not present

## 2018-10-14 DIAGNOSIS — I10 Essential (primary) hypertension: Secondary | ICD-10-CM | POA: Diagnosis not present

## 2018-11-09 DIAGNOSIS — J989 Respiratory disorder, unspecified: Secondary | ICD-10-CM | POA: Diagnosis not present

## 2018-11-09 DIAGNOSIS — E785 Hyperlipidemia, unspecified: Secondary | ICD-10-CM | POA: Diagnosis not present

## 2018-11-09 DIAGNOSIS — Z6828 Body mass index (BMI) 28.0-28.9, adult: Secondary | ICD-10-CM | POA: Diagnosis not present

## 2018-11-09 DIAGNOSIS — Z0000 Encounter for general adult medical examination without abnormal findings: Secondary | ICD-10-CM | POA: Diagnosis not present

## 2018-11-09 DIAGNOSIS — E663 Overweight: Secondary | ICD-10-CM | POA: Diagnosis not present

## 2018-11-09 DIAGNOSIS — K219 Gastro-esophageal reflux disease without esophagitis: Secondary | ICD-10-CM | POA: Diagnosis not present

## 2018-11-09 DIAGNOSIS — R739 Hyperglycemia, unspecified: Secondary | ICD-10-CM | POA: Diagnosis not present

## 2018-11-09 DIAGNOSIS — I10 Essential (primary) hypertension: Secondary | ICD-10-CM | POA: Diagnosis not present

## 2018-11-09 DIAGNOSIS — J309 Allergic rhinitis, unspecified: Secondary | ICD-10-CM | POA: Diagnosis not present

## 2018-11-09 DIAGNOSIS — R0982 Postnasal drip: Secondary | ICD-10-CM | POA: Diagnosis not present

## 2018-11-09 DIAGNOSIS — Z79899 Other long term (current) drug therapy: Secondary | ICD-10-CM | POA: Diagnosis not present

## 2018-11-09 DIAGNOSIS — Z8546 Personal history of malignant neoplasm of prostate: Secondary | ICD-10-CM | POA: Diagnosis not present

## 2018-11-30 DIAGNOSIS — J069 Acute upper respiratory infection, unspecified: Secondary | ICD-10-CM | POA: Diagnosis not present

## 2018-11-30 DIAGNOSIS — J309 Allergic rhinitis, unspecified: Secondary | ICD-10-CM | POA: Diagnosis not present

## 2018-11-30 DIAGNOSIS — Z23 Encounter for immunization: Secondary | ICD-10-CM | POA: Diagnosis not present

## 2018-11-30 DIAGNOSIS — J441 Chronic obstructive pulmonary disease with (acute) exacerbation: Secondary | ICD-10-CM | POA: Diagnosis not present

## 2018-11-30 DIAGNOSIS — Z2821 Immunization not carried out because of patient refusal: Secondary | ICD-10-CM | POA: Diagnosis not present

## 2018-11-30 DIAGNOSIS — Z6828 Body mass index (BMI) 28.0-28.9, adult: Secondary | ICD-10-CM | POA: Diagnosis not present

## 2018-11-30 DIAGNOSIS — R0982 Postnasal drip: Secondary | ICD-10-CM | POA: Diagnosis not present

## 2019-01-05 ENCOUNTER — Other Ambulatory Visit: Payer: Self-pay | Admitting: Internal Medicine

## 2019-01-13 DIAGNOSIS — E785 Hyperlipidemia, unspecified: Secondary | ICD-10-CM | POA: Diagnosis not present

## 2019-01-13 DIAGNOSIS — J441 Chronic obstructive pulmonary disease with (acute) exacerbation: Secondary | ICD-10-CM | POA: Diagnosis not present

## 2019-01-13 DIAGNOSIS — I10 Essential (primary) hypertension: Secondary | ICD-10-CM | POA: Diagnosis not present

## 2019-02-03 DIAGNOSIS — S46912A Strain of unspecified muscle, fascia and tendon at shoulder and upper arm level, left arm, initial encounter: Secondary | ICD-10-CM | POA: Diagnosis not present

## 2019-02-03 DIAGNOSIS — Z6828 Body mass index (BMI) 28.0-28.9, adult: Secondary | ICD-10-CM | POA: Diagnosis not present

## 2019-02-03 DIAGNOSIS — S638X2A Sprain of other part of left wrist and hand, initial encounter: Secondary | ICD-10-CM | POA: Diagnosis not present

## 2019-02-03 DIAGNOSIS — M25512 Pain in left shoulder: Secondary | ICD-10-CM | POA: Diagnosis not present

## 2019-02-10 DIAGNOSIS — M25512 Pain in left shoulder: Secondary | ICD-10-CM | POA: Diagnosis not present

## 2019-02-16 DIAGNOSIS — M75112 Incomplete rotator cuff tear or rupture of left shoulder, not specified as traumatic: Secondary | ICD-10-CM | POA: Diagnosis not present

## 2019-02-16 DIAGNOSIS — M25512 Pain in left shoulder: Secondary | ICD-10-CM | POA: Diagnosis not present

## 2019-02-17 DIAGNOSIS — M25512 Pain in left shoulder: Secondary | ICD-10-CM | POA: Diagnosis not present

## 2019-02-17 DIAGNOSIS — M75112 Incomplete rotator cuff tear or rupture of left shoulder, not specified as traumatic: Secondary | ICD-10-CM | POA: Diagnosis not present

## 2019-04-11 ENCOUNTER — Ambulatory Visit (INDEPENDENT_AMBULATORY_CARE_PROVIDER_SITE_OTHER): Payer: PPO | Admitting: Allergy and Immunology

## 2019-04-11 ENCOUNTER — Other Ambulatory Visit: Payer: Self-pay

## 2019-04-11 ENCOUNTER — Encounter: Payer: Self-pay | Admitting: Allergy and Immunology

## 2019-04-11 VITALS — BP 180/90 | HR 69 | Resp 16

## 2019-04-11 DIAGNOSIS — J4530 Mild persistent asthma, uncomplicated: Secondary | ICD-10-CM

## 2019-04-11 DIAGNOSIS — J3089 Other allergic rhinitis: Secondary | ICD-10-CM | POA: Diagnosis not present

## 2019-04-11 NOTE — Progress Notes (Signed)
[place] [facility] [place] [facility] [place]   Follow-up Note  Referring Provider: Street, Sharon Mt, * Primary Provider: Street, Sharon Mt, MD Date of Office Visit: 04/11/2019  Subjective:   Joshua Grant (DOB: November 28, 1951) is a 68 y.o. male who returns to the [facility] [place] on 04/11/2019 in re-evaluation of the following:  HPI: Joshua Grant returns to this clinic in reevaluation of asthma and exertional dyspnea and allergic rhinitis.  I last saw him in this clinic on 13 October 2018.  Overall he is about the same.  The same for Joshua Grant means that he does get dyspnea on exertion.  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  But if he exerts himself to a very large extent, such as using an ax, he gets very short of breath and must use a bronchodilator.  His recovery time is about 10 minutes.  As noted in the past, all previous evaluation for this issue has been nondiagnostic.  He did attempt to undergo a progressive aerobic exercise program at the [facility] but unfortunately he has had to terminate that activity because of the coronavirus pandemic.  He definitely felt a lot better when undergoing a program.  Overall he thinks his nose is doing okay on using a nasal steroid and montelukast.  He did obtain the flu vaccine this year.  Allergies as of 04/11/2019      Reactions   Amoxicillin-pot Clavulanate Nausea And Vomiting   Levaquin [levofloxacin]    Joint pain      Medication List      albuterol 108 (90 Base) MCG/ACT inhaler Commonly known as:  VENTOLIN HFA Can inhale two puffs every four to six hours as needed for cough or wheeze.  Can use 15 minutes prior to exercise if needed.   azelastine 0.1 % nasal spray Commonly known as:  ASTELIN Place 1 spray into both nostrils 2 (two) times daily. USE ALONG WITH FLONASE   fluticasone 50 MCG/ACT nasal spray Commonly known as:   FLONASE Place 2 sprays into both nostrils 3 times/day as needed-between meals & bedtime for allergies or rhinitis.   loratadine 10 MG tablet Commonly known as:  CLARITIN Take 1-2 tablets 1 time per day as needed.   losartan 25 MG tablet Commonly known as:  COZAAR Take 25 mg by mouth daily. for high blood pressure   mometasone 220 MCG/INH inhaler Commonly known as:  ASMANEX Inhale 1 puff into the lungs daily.   montelukast 10 MG tablet Commonly known as:  SINGULAIR TAKE 1 TABLET BY MOUTH EVERYDAY AT BEDTIME   pantoprazole 40 MG tablet Commonly known as:  PROTONIX TAKE 1 TABLET (40 MG TOTAL) BY MOUTH DAILY. TAKE 30-60 MIN BEFORE FIRST MEAL OF THE DAY       Past Medical History:  Diagnosis Date  . Angio-edema   . Asthma   . High blood pressure     History reviewed. No pertinent surgical history.  Review of systems negative except as noted in HPI / PMHx or noted below:  Review of Systems  Constitutional: Negative.   HENT: Negative.   Eyes: Negative.   Respiratory: Negative.   Cardiovascular: Negative.   Gastrointestinal: Negative.   Genitourinary: Negative.   Musculoskeletal: Negative.   Skin: Negative.   Neurological: Negative.   Endo/Heme/Allergies: Negative.   Psychiatric/Behavioral: Negative.      Objective:   Vitals:  04/11/19 0852 04/11/19 0853  BP: (!) 170/100 (!) 180/90  Pulse: 69   Resp: 16           Physical Exam Constitutional:      Appearance: He is not diaphoretic.  HENT:     Head: Normocephalic.     Right Ear: Tympanic membrane, ear canal and external ear normal.     Left Ear: Tympanic membrane, ear canal and external ear normal.     Nose: Nose normal. No mucosal edema or rhinorrhea.     Mouth/Throat:     Pharynx: Uvula midline. No oropharyngeal exudate.  Eyes:     Conjunctiva/sclera: Conjunctivae normal.  Neck:     Thyroid: No thyromegaly.     Trachea: Trachea normal. No tracheal tenderness or tracheal deviation.   Cardiovascular:     Rate and Rhythm: Normal rate and regular rhythm.     Heart sounds: Normal heart sounds, S1 normal and S2 normal. No murmur.  Pulmonary:     Effort: No respiratory distress.     Breath sounds: Normal breath sounds. No stridor. No wheezing or rales.  Lymphadenopathy:     Head:     Right side of head: No tonsillar adenopathy.     Left side of head: No tonsillar adenopathy.     Cervical: No cervical adenopathy.  Skin:    Findings: No erythema or rash.     Nails: There is no clubbing.   Neurological:     Mental Status: He is alert.     Diagnostics:    Spirometry was performed and demonstrated an FEV1 of 3.11 at 97 % of predicted.   Assessment and Plan:   1. Asthma, well controlled, mild persistent   2. Perennial allergic rhinitis      1.  Continue to treat and prevent inflammation:   A.  Fluticasone -1 spray each nostril twice a day  B.  Montelukast 10 mg -1 tablet 1 time per day  2.  If needed:   A.  Nasal saline  B.  Loratadine 10 mg -1-2 tablets 1 time per day  C.  Proair HFA or similar - 2 inhalations every 4-6 hours or prior to exercise  3.  Initiate a slow progressive exercise routine aiming for 30 - 60 minutes a day  4. Return to clinic in 6 months or earlier if problem  Overall Joshua Grant appears to be doing relatively well on his current therapy which includes anti-inflammatory agents for his airway and few medications to be utilized as needed.  I have once again encouraged him to undergo a progressive exercise routine which will probably help with some of his exertional dyspnea.  Hopefully he can get this initiated once the coronavirus pandemic resolves.  I will see him back in this clinic in 6 months or earlier if there is a problem.  Allena Katz, MD Allergy / Immunology [place]

## 2019-04-11 NOTE — Patient Instructions (Signed)
  1.  Continue to treat and prevent inflammation:   A.  Fluticasone -1 spray each nostril twice a day  B.  Montelukast 10 mg -1 tablet 1 time per day  2.  If needed:   A.  Nasal saline  B.  Loratadine 10 mg -1-2 tablets 1 time per day  C.  Proair HFA or similar - 2 inhalations every 4-6 hours or prior to exercise  3.  Initiate a slow progressive exercise routine aiming for 30 - 60 minutes a day  4. Return to clinic in 6 months or earlier if problem

## 2019-04-12 ENCOUNTER — Encounter: Payer: Self-pay | Admitting: Allergy and Immunology

## 2019-04-14 DIAGNOSIS — E1169 Type 2 diabetes mellitus with other specified complication: Secondary | ICD-10-CM | POA: Diagnosis not present

## 2019-04-14 DIAGNOSIS — I10 Essential (primary) hypertension: Secondary | ICD-10-CM | POA: Diagnosis not present

## 2019-07-11 DIAGNOSIS — H25812 Combined forms of age-related cataract, left eye: Secondary | ICD-10-CM | POA: Diagnosis not present

## 2019-09-14 DIAGNOSIS — J309 Allergic rhinitis, unspecified: Secondary | ICD-10-CM | POA: Diagnosis not present

## 2019-09-14 DIAGNOSIS — I10 Essential (primary) hypertension: Secondary | ICD-10-CM | POA: Diagnosis not present

## 2019-09-14 DIAGNOSIS — E785 Hyperlipidemia, unspecified: Secondary | ICD-10-CM | POA: Diagnosis not present

## 2019-09-19 DIAGNOSIS — Z79899 Other long term (current) drug therapy: Secondary | ICD-10-CM | POA: Diagnosis not present

## 2019-09-19 DIAGNOSIS — Z23 Encounter for immunization: Secondary | ICD-10-CM | POA: Diagnosis not present

## 2019-09-19 DIAGNOSIS — E785 Hyperlipidemia, unspecified: Secondary | ICD-10-CM | POA: Diagnosis not present

## 2019-09-28 ENCOUNTER — Telehealth: Payer: Self-pay | Admitting: *Deleted

## 2019-09-28 NOTE — Telephone Encounter (Signed)
Joshua Grant's refused his office visit,said he didn't need to see anyone.

## 2019-10-14 DIAGNOSIS — E785 Hyperlipidemia, unspecified: Secondary | ICD-10-CM | POA: Diagnosis not present

## 2019-10-14 DIAGNOSIS — I10 Essential (primary) hypertension: Secondary | ICD-10-CM | POA: Diagnosis not present

## 2019-10-19 ENCOUNTER — Other Ambulatory Visit: Payer: Self-pay | Admitting: Orthopedic Surgery

## 2019-10-19 DIAGNOSIS — M25512 Pain in left shoulder: Secondary | ICD-10-CM

## 2019-10-31 DIAGNOSIS — J989 Respiratory disorder, unspecified: Secondary | ICD-10-CM | POA: Diagnosis not present

## 2019-10-31 DIAGNOSIS — J309 Allergic rhinitis, unspecified: Secondary | ICD-10-CM | POA: Diagnosis not present

## 2019-10-31 DIAGNOSIS — J40 Bronchitis, not specified as acute or chronic: Secondary | ICD-10-CM | POA: Diagnosis not present

## 2019-10-31 DIAGNOSIS — Z6828 Body mass index (BMI) 28.0-28.9, adult: Secondary | ICD-10-CM | POA: Diagnosis not present

## 2019-10-31 DIAGNOSIS — J329 Chronic sinusitis, unspecified: Secondary | ICD-10-CM | POA: Diagnosis not present

## 2019-10-31 DIAGNOSIS — R0982 Postnasal drip: Secondary | ICD-10-CM | POA: Diagnosis not present

## 2019-11-03 ENCOUNTER — Other Ambulatory Visit: Payer: PPO

## 2019-11-14 DIAGNOSIS — E785 Hyperlipidemia, unspecified: Secondary | ICD-10-CM | POA: Diagnosis not present

## 2019-11-14 DIAGNOSIS — I10 Essential (primary) hypertension: Secondary | ICD-10-CM | POA: Diagnosis not present

## 2019-12-06 ENCOUNTER — Other Ambulatory Visit: Payer: Self-pay

## 2019-12-06 MED ORDER — ALBUTEROL SULFATE HFA 108 (90 BASE) MCG/ACT IN AERS: INHALATION_SPRAY | RESPIRATORY_TRACT | 0 refills | Status: AC

## 2019-12-07 DIAGNOSIS — S0342XA Sprain of jaw, left side, initial encounter: Secondary | ICD-10-CM | POA: Diagnosis not present

## 2019-12-07 DIAGNOSIS — Z6829 Body mass index (BMI) 29.0-29.9, adult: Secondary | ICD-10-CM | POA: Diagnosis not present

## 2019-12-18 IMAGING — CT CT HEART MORP W/ CTA COR W/ SCORE W/ CA W/CM &/OR W/O CM
1 series · 14 of 20 positions shown, 18 images · non-contrast
Comparison: Chest radiograph 04/23/2018. Routine chest CT
08/07/2016 also reviewed.

CLINICAL DATA: Chest pain

EXAM:
Cardiac CTA
MEDICATIONS:
Sub lingual nitro. 4mg x 2
TECHNIQUE: The patient was scanned on a Siemens [REDACTED]ice scanner. Gantry
rotation speed was 250 msecs. Collimation was 0.8 mm. A 100 kV
prospective scan was triggered in the ascending thoracic aorta at
35-75% of the R-R interval. Average HR during the scan was 60 bpm.
The 3D data set was interpreted on a dedicated work station using
MPR, MIP and VRT modes. A total of 80cc of contrast was used.

[Series 4: ds_cascseq 3.0 qr36 70% · axial · 0.39mm/px · z∈[+862,+990]mm · 14 of 95 slices shown, 18 images]
[im 5/95  vessel]
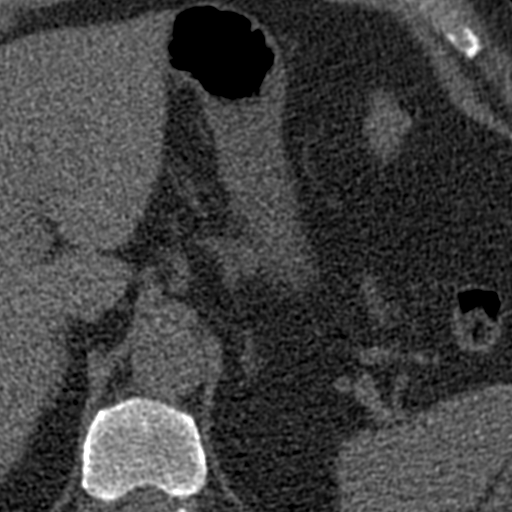
[im 5/95  lung]
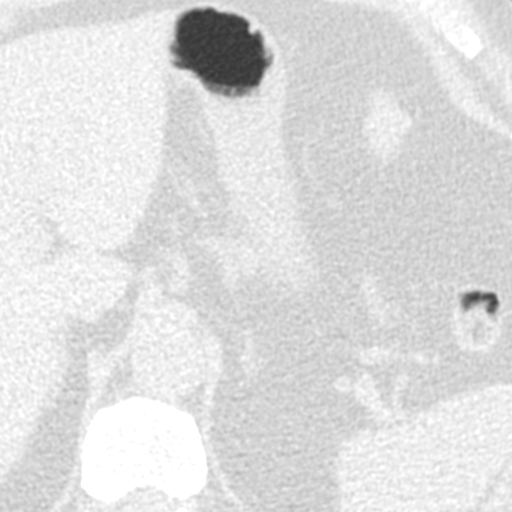
[im 10/95  vessel]
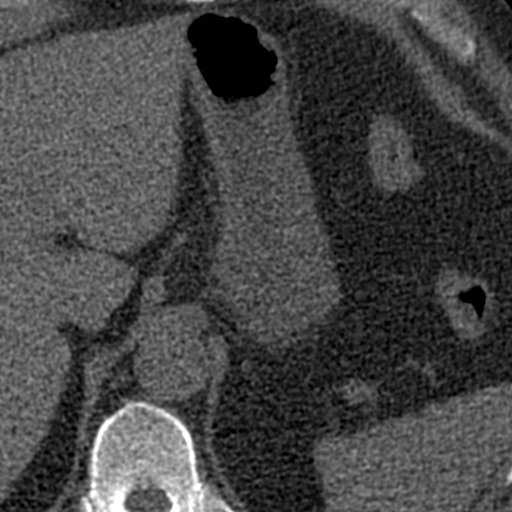
[im 20/95  vessel]
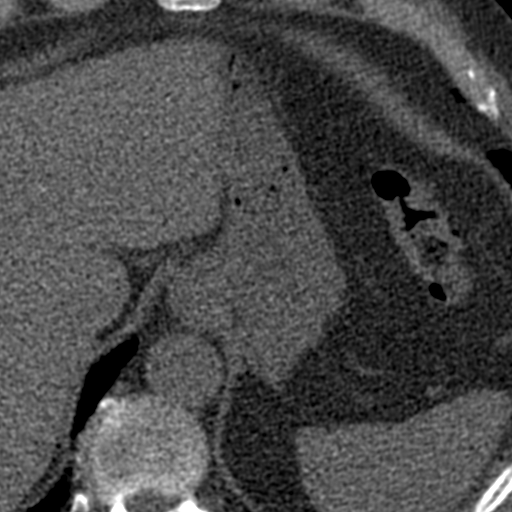
[im 25/95  vessel]
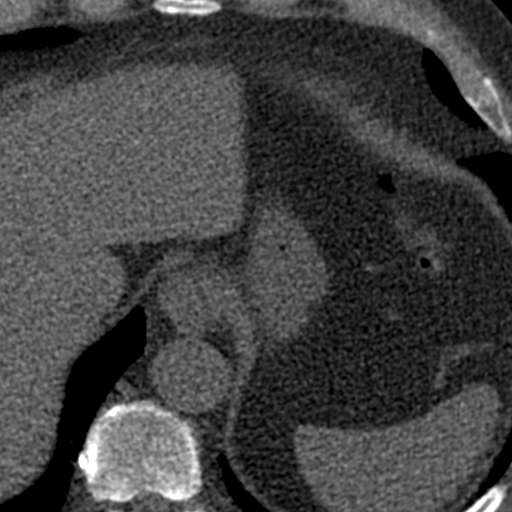
[im 30/95  vessel]
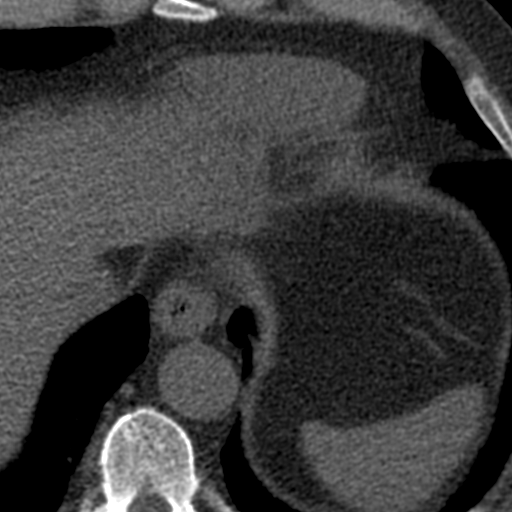
[im 30/95  lung]
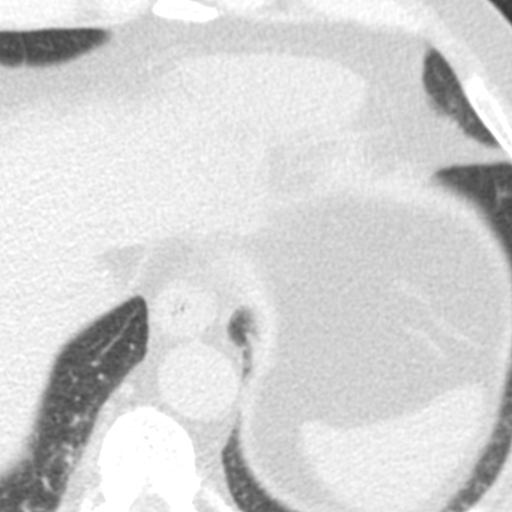
[im 40/95  vessel]
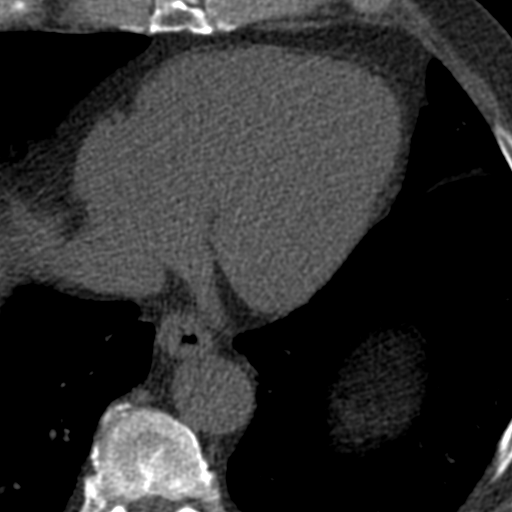
[im 45/95  vessel]
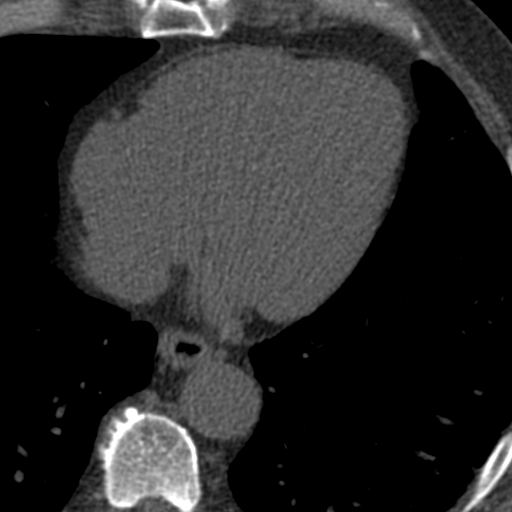
[im 50/95  vessel]
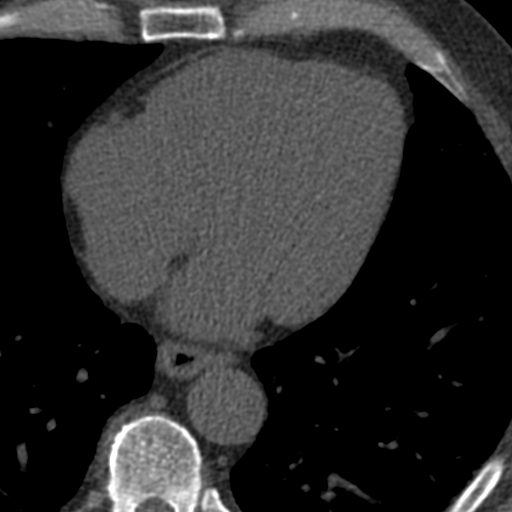
[im 55/95  vessel]
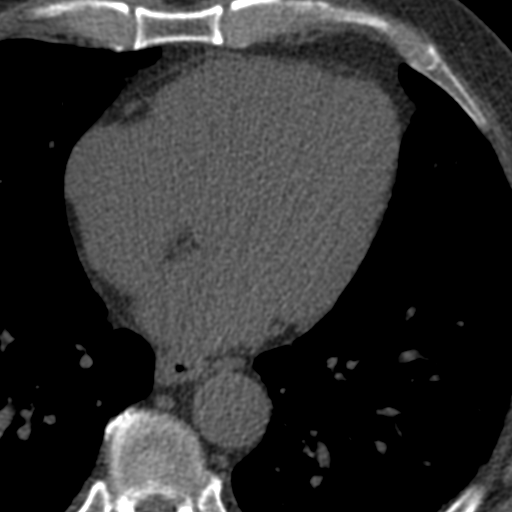
[im 55/95  lung]
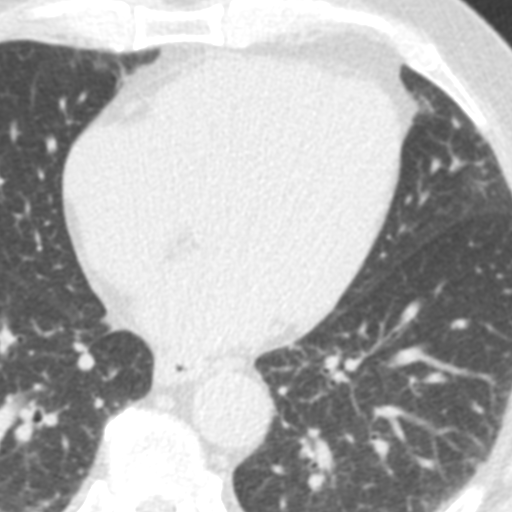
[im 65/95  vessel]
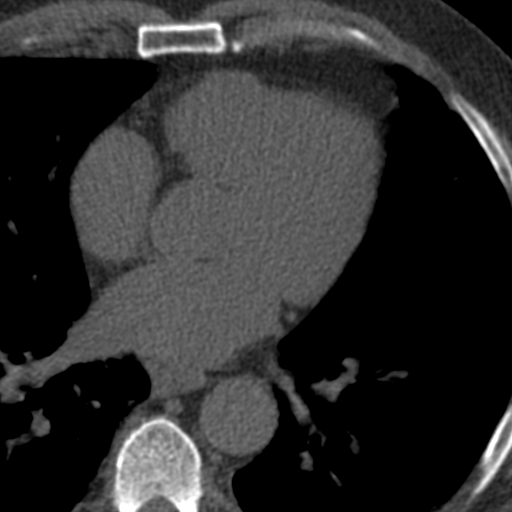
[im 70/95  vessel]
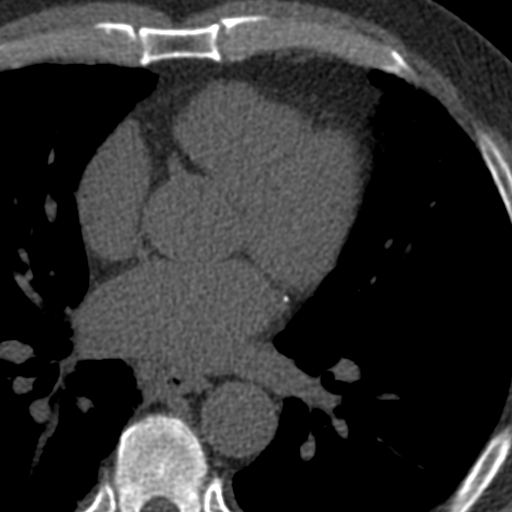
[im 75/95  vessel]
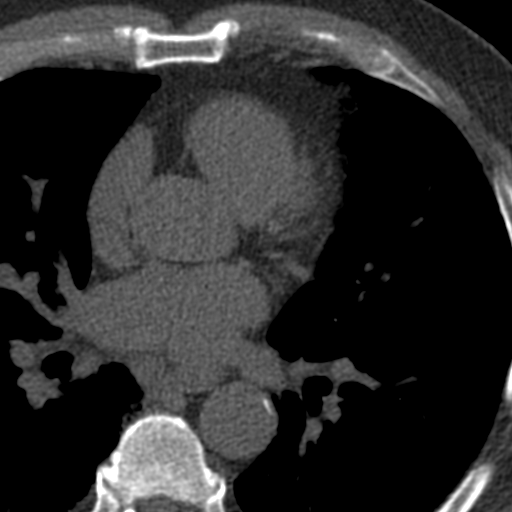
[im 85/95  vessel]
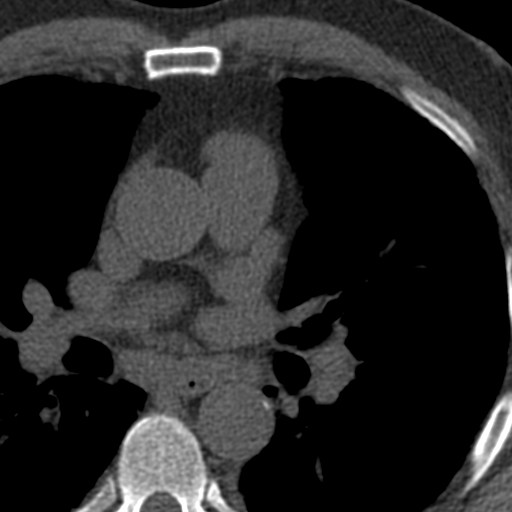
[im 85/95  lung]
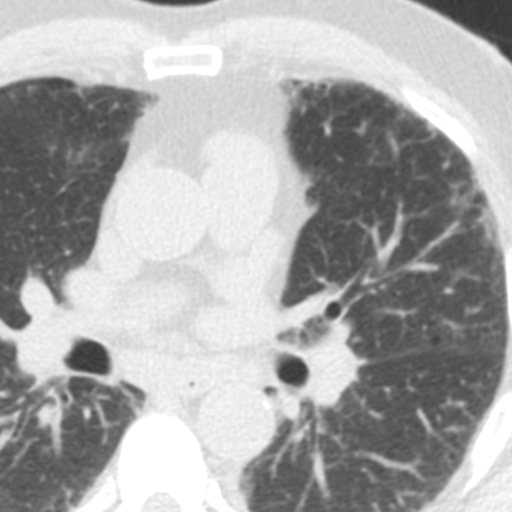
[im 90/95  vessel]
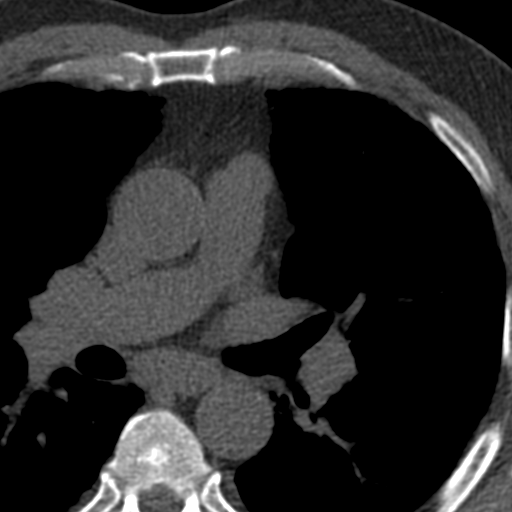

[14 of 20 positions shown; findings below may reference images not displayed]

FINDINGS: Non-cardiac: See separate report from [REDACTED].

Calcium Score: 56 Agatston units.

Coronary Arteries: Right dominant with no anomalies

LM: No plaque or stenosis.

LAD system: No plaque or stenosis.

Circumflex system: Mixed plaque mid LCx with mild stenosis.

RCA system: No plaque or stenosis.
IMPRESSION: 1. Coronary artery calcium score 56 Agatston units. This places the
patient in the 41st percentile for age and gender, suggesting
intermediate risk for future cardiac events.

2.  Mild stenosis in the mid LCx.

Blade Aujla

EXAM:
OVER-READ INTERPRETATION  CT CHEST

The following report is an over-read performed by radiologist Dr.
Yaretzi Sundaram [REDACTED] on 07/27/2018. This over-read
does not include interpretation of cardiac or coronary anatomy or
pathology. The coronary CTA interpretation by the cardiologist is
attached.
FINDINGS: Vascular: Normal aortic caliber. Aortic atherosclerosis. No imaged
pulmonary embolism on this nondedicated study.

Mediastinum/Nodes: Mildly enlarged subcarinal node of 1.4 cm on
[DATE], increased from 1.1 cm on the prior.

Right infrahilar node is mildly enlarged 11 mm on [DATE]. Tiny hiatal
hernia.

Lungs/Pleura: Mild bilateral pleural thickening. No pleural fluid.
Minimal motion degradation. Areas of mild subpleural interstitial
thickening bilaterally. Most significant within the lingula. Given
differences in technique, relatively similar to 08/07/16

Upper Abdomen: Normal imaged portions of the liver, spleen, stomach.

Musculoskeletal: No acute osseous abnormality.
IMPRESSION: 1. No acute extracardiac abnormality within the imaged chest.
2. Subpleural interstitial opacities within both lungs, mild.
Relatively similar to 08/07/2016. Considerations include post
infectious or inflammatory scarring versus mild nonspecific
interstitial pneumonitis.
3. Mild thoracic adenopathy, favored to be reactive. Nodal size
slightly increased compared to 0899.

## 2019-12-21 DIAGNOSIS — J989 Respiratory disorder, unspecified: Secondary | ICD-10-CM | POA: Diagnosis not present

## 2019-12-21 DIAGNOSIS — Z79899 Other long term (current) drug therapy: Secondary | ICD-10-CM | POA: Diagnosis not present

## 2019-12-21 DIAGNOSIS — R739 Hyperglycemia, unspecified: Secondary | ICD-10-CM | POA: Diagnosis not present

## 2019-12-21 DIAGNOSIS — Z8546 Personal history of malignant neoplasm of prostate: Secondary | ICD-10-CM | POA: Diagnosis not present

## 2019-12-21 DIAGNOSIS — R0600 Dyspnea, unspecified: Secondary | ICD-10-CM | POA: Diagnosis not present

## 2019-12-21 DIAGNOSIS — E785 Hyperlipidemia, unspecified: Secondary | ICD-10-CM | POA: Diagnosis not present

## 2019-12-21 DIAGNOSIS — R0982 Postnasal drip: Secondary | ICD-10-CM | POA: Diagnosis not present

## 2019-12-21 DIAGNOSIS — Z0000 Encounter for general adult medical examination without abnormal findings: Secondary | ICD-10-CM | POA: Diagnosis not present

## 2019-12-21 DIAGNOSIS — K219 Gastro-esophageal reflux disease without esophagitis: Secondary | ICD-10-CM | POA: Diagnosis not present

## 2019-12-21 DIAGNOSIS — M159 Polyosteoarthritis, unspecified: Secondary | ICD-10-CM | POA: Diagnosis not present

## 2019-12-21 DIAGNOSIS — J309 Allergic rhinitis, unspecified: Secondary | ICD-10-CM | POA: Diagnosis not present

## 2019-12-21 DIAGNOSIS — I10 Essential (primary) hypertension: Secondary | ICD-10-CM | POA: Diagnosis not present

## 2019-12-26 DIAGNOSIS — C61 Malignant neoplasm of prostate: Secondary | ICD-10-CM | POA: Diagnosis not present

## 2019-12-26 DIAGNOSIS — J988 Other specified respiratory disorders: Secondary | ICD-10-CM | POA: Diagnosis not present

## 2019-12-26 DIAGNOSIS — R0600 Dyspnea, unspecified: Secondary | ICD-10-CM | POA: Diagnosis not present

## 2020-01-14 DIAGNOSIS — I10 Essential (primary) hypertension: Secondary | ICD-10-CM | POA: Diagnosis not present

## 2020-01-14 DIAGNOSIS — I2510 Atherosclerotic heart disease of native coronary artery without angina pectoris: Secondary | ICD-10-CM | POA: Diagnosis not present

## 2020-01-14 DIAGNOSIS — E785 Hyperlipidemia, unspecified: Secondary | ICD-10-CM | POA: Diagnosis not present

## 2020-01-16 DIAGNOSIS — Z20828 Contact with and (suspected) exposure to other viral communicable diseases: Secondary | ICD-10-CM | POA: Diagnosis not present

## 2020-01-16 DIAGNOSIS — J989 Respiratory disorder, unspecified: Secondary | ICD-10-CM | POA: Diagnosis not present

## 2020-01-16 DIAGNOSIS — J982 Interstitial emphysema: Secondary | ICD-10-CM | POA: Diagnosis not present

## 2020-01-16 DIAGNOSIS — J22 Unspecified acute lower respiratory infection: Secondary | ICD-10-CM | POA: Diagnosis not present

## 2020-01-16 DIAGNOSIS — U071 COVID-19: Secondary | ICD-10-CM | POA: Diagnosis not present

## 2020-01-27 DIAGNOSIS — J982 Interstitial emphysema: Secondary | ICD-10-CM | POA: Diagnosis not present

## 2020-01-27 DIAGNOSIS — I10 Essential (primary) hypertension: Secondary | ICD-10-CM | POA: Diagnosis not present

## 2020-01-27 DIAGNOSIS — I2510 Atherosclerotic heart disease of native coronary artery without angina pectoris: Secondary | ICD-10-CM | POA: Diagnosis not present

## 2020-01-27 DIAGNOSIS — M159 Polyosteoarthritis, unspecified: Secondary | ICD-10-CM | POA: Diagnosis not present

## 2020-01-27 DIAGNOSIS — Z6828 Body mass index (BMI) 28.0-28.9, adult: Secondary | ICD-10-CM | POA: Diagnosis not present

## 2020-01-27 DIAGNOSIS — J989 Respiratory disorder, unspecified: Secondary | ICD-10-CM | POA: Diagnosis not present

## 2020-01-27 DIAGNOSIS — Z8546 Personal history of malignant neoplasm of prostate: Secondary | ICD-10-CM | POA: Diagnosis not present

## 2020-01-27 DIAGNOSIS — I2584 Coronary atherosclerosis due to calcified coronary lesion: Secondary | ICD-10-CM | POA: Diagnosis not present

## 2020-01-27 DIAGNOSIS — J189 Pneumonia, unspecified organism: Secondary | ICD-10-CM | POA: Diagnosis not present

## 2020-01-27 DIAGNOSIS — E663 Overweight: Secondary | ICD-10-CM | POA: Diagnosis not present

## 2020-01-27 DIAGNOSIS — I700 Atherosclerosis of aorta: Secondary | ICD-10-CM | POA: Diagnosis not present

## 2020-03-01 DIAGNOSIS — J989 Respiratory disorder, unspecified: Secondary | ICD-10-CM | POA: Diagnosis not present

## 2020-03-01 DIAGNOSIS — Z1331 Encounter for screening for depression: Secondary | ICD-10-CM | POA: Diagnosis not present

## 2020-03-01 DIAGNOSIS — J982 Interstitial emphysema: Secondary | ICD-10-CM | POA: Diagnosis not present

## 2020-03-01 DIAGNOSIS — E663 Overweight: Secondary | ICD-10-CM | POA: Diagnosis not present

## 2020-03-01 DIAGNOSIS — J309 Allergic rhinitis, unspecified: Secondary | ICD-10-CM | POA: Diagnosis not present

## 2020-03-01 DIAGNOSIS — R0982 Postnasal drip: Secondary | ICD-10-CM | POA: Diagnosis not present

## 2020-03-01 DIAGNOSIS — J3489 Other specified disorders of nose and nasal sinuses: Secondary | ICD-10-CM | POA: Diagnosis not present

## 2020-03-01 DIAGNOSIS — Z6828 Body mass index (BMI) 28.0-28.9, adult: Secondary | ICD-10-CM | POA: Diagnosis not present

## 2020-04-02 DIAGNOSIS — J84112 Idiopathic pulmonary fibrosis: Secondary | ICD-10-CM | POA: Diagnosis not present

## 2020-04-02 DIAGNOSIS — J4520 Mild intermittent asthma, uncomplicated: Secondary | ICD-10-CM | POA: Diagnosis not present

## 2020-04-02 DIAGNOSIS — J301 Allergic rhinitis due to pollen: Secondary | ICD-10-CM | POA: Diagnosis not present

## 2020-04-11 DIAGNOSIS — K449 Diaphragmatic hernia without obstruction or gangrene: Secondary | ICD-10-CM | POA: Diagnosis not present

## 2020-04-11 DIAGNOSIS — R590 Localized enlarged lymph nodes: Secondary | ICD-10-CM | POA: Diagnosis not present

## 2020-04-11 DIAGNOSIS — J439 Emphysema, unspecified: Secondary | ICD-10-CM | POA: Diagnosis not present

## 2020-04-11 DIAGNOSIS — N200 Calculus of kidney: Secondary | ICD-10-CM | POA: Diagnosis not present

## 2020-04-11 DIAGNOSIS — I700 Atherosclerosis of aorta: Secondary | ICD-10-CM | POA: Diagnosis not present

## 2020-04-11 DIAGNOSIS — J432 Centrilobular emphysema: Secondary | ICD-10-CM | POA: Diagnosis not present

## 2020-04-11 DIAGNOSIS — J8410 Pulmonary fibrosis, unspecified: Secondary | ICD-10-CM | POA: Diagnosis not present

## 2020-04-18 DIAGNOSIS — J84112 Idiopathic pulmonary fibrosis: Secondary | ICD-10-CM | POA: Diagnosis not present

## 2020-04-18 DIAGNOSIS — J301 Allergic rhinitis due to pollen: Secondary | ICD-10-CM | POA: Diagnosis not present

## 2020-04-18 DIAGNOSIS — J4520 Mild intermittent asthma, uncomplicated: Secondary | ICD-10-CM | POA: Diagnosis not present

## 2020-05-11 DIAGNOSIS — J84112 Idiopathic pulmonary fibrosis: Secondary | ICD-10-CM | POA: Diagnosis not present

## 2020-05-11 DIAGNOSIS — J4520 Mild intermittent asthma, uncomplicated: Secondary | ICD-10-CM | POA: Diagnosis not present

## 2020-05-14 DIAGNOSIS — I10 Essential (primary) hypertension: Secondary | ICD-10-CM | POA: Diagnosis not present

## 2020-05-14 DIAGNOSIS — E785 Hyperlipidemia, unspecified: Secondary | ICD-10-CM | POA: Diagnosis not present

## 2020-05-23 DIAGNOSIS — J84112 Idiopathic pulmonary fibrosis: Secondary | ICD-10-CM | POA: Diagnosis not present

## 2020-05-23 DIAGNOSIS — J4520 Mild intermittent asthma, uncomplicated: Secondary | ICD-10-CM | POA: Diagnosis not present

## 2020-05-23 DIAGNOSIS — J301 Allergic rhinitis due to pollen: Secondary | ICD-10-CM | POA: Diagnosis not present

## 2020-06-29 DIAGNOSIS — J4520 Mild intermittent asthma, uncomplicated: Secondary | ICD-10-CM | POA: Diagnosis not present

## 2020-06-29 DIAGNOSIS — J84112 Idiopathic pulmonary fibrosis: Secondary | ICD-10-CM | POA: Diagnosis not present

## 2020-06-29 DIAGNOSIS — J301 Allergic rhinitis due to pollen: Secondary | ICD-10-CM | POA: Diagnosis not present

## 2020-07-03 DIAGNOSIS — J301 Allergic rhinitis due to pollen: Secondary | ICD-10-CM | POA: Diagnosis not present

## 2020-07-10 DIAGNOSIS — J301 Allergic rhinitis due to pollen: Secondary | ICD-10-CM | POA: Diagnosis not present

## 2020-08-14 ENCOUNTER — Other Ambulatory Visit: Payer: Self-pay | Admitting: Allergy and Immunology

## 2020-10-09 DIAGNOSIS — J432 Centrilobular emphysema: Secondary | ICD-10-CM | POA: Diagnosis not present

## 2020-10-09 DIAGNOSIS — J8410 Pulmonary fibrosis, unspecified: Secondary | ICD-10-CM | POA: Diagnosis not present

## 2020-10-09 DIAGNOSIS — J84112 Idiopathic pulmonary fibrosis: Secondary | ICD-10-CM | POA: Diagnosis not present

## 2020-10-09 DIAGNOSIS — I2510 Atherosclerotic heart disease of native coronary artery without angina pectoris: Secondary | ICD-10-CM | POA: Diagnosis not present

## 2020-10-09 DIAGNOSIS — K449 Diaphragmatic hernia without obstruction or gangrene: Secondary | ICD-10-CM | POA: Diagnosis not present

## 2020-10-17 DIAGNOSIS — J479 Bronchiectasis, uncomplicated: Secondary | ICD-10-CM | POA: Diagnosis not present

## 2020-10-17 DIAGNOSIS — J301 Allergic rhinitis due to pollen: Secondary | ICD-10-CM | POA: Diagnosis not present

## 2020-10-17 DIAGNOSIS — J4520 Mild intermittent asthma, uncomplicated: Secondary | ICD-10-CM | POA: Diagnosis not present

## 2020-10-17 DIAGNOSIS — J84112 Idiopathic pulmonary fibrosis: Secondary | ICD-10-CM | POA: Diagnosis not present

## 2020-12-03 DIAGNOSIS — J479 Bronchiectasis, uncomplicated: Secondary | ICD-10-CM | POA: Diagnosis not present

## 2020-12-03 DIAGNOSIS — J4520 Mild intermittent asthma, uncomplicated: Secondary | ICD-10-CM | POA: Diagnosis not present

## 2020-12-03 DIAGNOSIS — J84112 Idiopathic pulmonary fibrosis: Secondary | ICD-10-CM | POA: Diagnosis not present

## 2020-12-03 DIAGNOSIS — J301 Allergic rhinitis due to pollen: Secondary | ICD-10-CM | POA: Diagnosis not present

## 2020-12-18 ENCOUNTER — Other Ambulatory Visit: Payer: Self-pay | Admitting: Allergy and Immunology

## 2020-12-24 ENCOUNTER — Other Ambulatory Visit: Payer: Self-pay | Admitting: Allergy and Immunology

## 2021-03-12 DIAGNOSIS — J301 Allergic rhinitis due to pollen: Secondary | ICD-10-CM | POA: Diagnosis not present

## 2021-03-12 DIAGNOSIS — J84112 Idiopathic pulmonary fibrosis: Secondary | ICD-10-CM | POA: Diagnosis not present

## 2021-03-12 DIAGNOSIS — J4520 Mild intermittent asthma, uncomplicated: Secondary | ICD-10-CM | POA: Diagnosis not present

## 2021-03-12 DIAGNOSIS — J479 Bronchiectasis, uncomplicated: Secondary | ICD-10-CM | POA: Diagnosis not present

## 2021-03-13 DIAGNOSIS — J4520 Mild intermittent asthma, uncomplicated: Secondary | ICD-10-CM | POA: Diagnosis not present

## 2021-04-13 DIAGNOSIS — J4520 Mild intermittent asthma, uncomplicated: Secondary | ICD-10-CM | POA: Diagnosis not present

## 2021-04-22 DIAGNOSIS — J984 Other disorders of lung: Secondary | ICD-10-CM | POA: Diagnosis not present

## 2021-04-22 DIAGNOSIS — J84112 Idiopathic pulmonary fibrosis: Secondary | ICD-10-CM | POA: Diagnosis not present

## 2021-04-22 DIAGNOSIS — J479 Bronchiectasis, uncomplicated: Secondary | ICD-10-CM | POA: Diagnosis not present

## 2021-04-22 DIAGNOSIS — J432 Centrilobular emphysema: Secondary | ICD-10-CM | POA: Diagnosis not present

## 2021-04-24 DIAGNOSIS — J4520 Mild intermittent asthma, uncomplicated: Secondary | ICD-10-CM | POA: Diagnosis not present

## 2021-04-24 DIAGNOSIS — J479 Bronchiectasis, uncomplicated: Secondary | ICD-10-CM | POA: Diagnosis not present

## 2021-04-24 DIAGNOSIS — J84112 Idiopathic pulmonary fibrosis: Secondary | ICD-10-CM | POA: Diagnosis not present

## 2021-04-24 DIAGNOSIS — J301 Allergic rhinitis due to pollen: Secondary | ICD-10-CM | POA: Diagnosis not present

## 2021-05-13 DIAGNOSIS — J4520 Mild intermittent asthma, uncomplicated: Secondary | ICD-10-CM | POA: Diagnosis not present

## 2021-05-15 DIAGNOSIS — J989 Respiratory disorder, unspecified: Secondary | ICD-10-CM | POA: Diagnosis not present

## 2021-05-15 DIAGNOSIS — Z6826 Body mass index (BMI) 26.0-26.9, adult: Secondary | ICD-10-CM | POA: Diagnosis not present

## 2021-05-15 DIAGNOSIS — R0982 Postnasal drip: Secondary | ICD-10-CM | POA: Diagnosis not present

## 2021-05-15 DIAGNOSIS — H8190 Unspecified disorder of vestibular function, unspecified ear: Secondary | ICD-10-CM | POA: Diagnosis not present

## 2021-05-15 DIAGNOSIS — J309 Allergic rhinitis, unspecified: Secondary | ICD-10-CM | POA: Diagnosis not present

## 2021-05-27 DIAGNOSIS — H8190 Unspecified disorder of vestibular function, unspecified ear: Secondary | ICD-10-CM | POA: Diagnosis not present

## 2021-06-13 DIAGNOSIS — J4520 Mild intermittent asthma, uncomplicated: Secondary | ICD-10-CM | POA: Diagnosis not present

## 2021-06-17 DIAGNOSIS — H81399 Other peripheral vertigo, unspecified ear: Secondary | ICD-10-CM | POA: Diagnosis not present

## 2021-06-17 DIAGNOSIS — H7290 Unspecified perforation of tympanic membrane, unspecified ear: Secondary | ICD-10-CM | POA: Diagnosis not present

## 2021-06-24 DIAGNOSIS — H25812 Combined forms of age-related cataract, left eye: Secondary | ICD-10-CM | POA: Diagnosis not present

## 2021-06-26 DIAGNOSIS — H8110 Benign paroxysmal vertigo, unspecified ear: Secondary | ICD-10-CM | POA: Diagnosis not present

## 2021-06-26 DIAGNOSIS — R2681 Unsteadiness on feet: Secondary | ICD-10-CM | POA: Diagnosis not present

## 2021-06-26 DIAGNOSIS — R42 Dizziness and giddiness: Secondary | ICD-10-CM | POA: Diagnosis not present

## 2021-07-03 DIAGNOSIS — R42 Dizziness and giddiness: Secondary | ICD-10-CM | POA: Diagnosis not present

## 2021-07-03 DIAGNOSIS — R2681 Unsteadiness on feet: Secondary | ICD-10-CM | POA: Diagnosis not present

## 2021-07-03 DIAGNOSIS — H8110 Benign paroxysmal vertigo, unspecified ear: Secondary | ICD-10-CM | POA: Diagnosis not present

## 2021-07-10 DIAGNOSIS — Z8546 Personal history of malignant neoplasm of prostate: Secondary | ICD-10-CM | POA: Diagnosis not present

## 2021-07-10 DIAGNOSIS — I2510 Atherosclerotic heart disease of native coronary artery without angina pectoris: Secondary | ICD-10-CM | POA: Diagnosis not present

## 2021-07-10 DIAGNOSIS — G720 Drug-induced myopathy: Secondary | ICD-10-CM | POA: Diagnosis not present

## 2021-07-10 DIAGNOSIS — Z0000 Encounter for general adult medical examination without abnormal findings: Secondary | ICD-10-CM | POA: Diagnosis not present

## 2021-07-10 DIAGNOSIS — E785 Hyperlipidemia, unspecified: Secondary | ICD-10-CM | POA: Diagnosis not present

## 2021-07-10 DIAGNOSIS — Z79899 Other long term (current) drug therapy: Secondary | ICD-10-CM | POA: Diagnosis not present

## 2021-07-10 DIAGNOSIS — I10 Essential (primary) hypertension: Secondary | ICD-10-CM | POA: Diagnosis not present

## 2021-07-10 DIAGNOSIS — K2960 Other gastritis without bleeding: Secondary | ICD-10-CM | POA: Diagnosis not present

## 2021-07-10 DIAGNOSIS — T466X5A Adverse effect of antihyperlipidemic and antiarteriosclerotic drugs, initial encounter: Secondary | ICD-10-CM | POA: Diagnosis not present

## 2021-07-13 DIAGNOSIS — J4520 Mild intermittent asthma, uncomplicated: Secondary | ICD-10-CM | POA: Diagnosis not present

## 2021-07-29 DIAGNOSIS — J301 Allergic rhinitis due to pollen: Secondary | ICD-10-CM | POA: Diagnosis not present

## 2021-07-29 DIAGNOSIS — J479 Bronchiectasis, uncomplicated: Secondary | ICD-10-CM | POA: Diagnosis not present

## 2021-07-29 DIAGNOSIS — J4520 Mild intermittent asthma, uncomplicated: Secondary | ICD-10-CM | POA: Diagnosis not present

## 2021-07-29 DIAGNOSIS — J84112 Idiopathic pulmonary fibrosis: Secondary | ICD-10-CM | POA: Diagnosis not present

## 2021-08-01 DIAGNOSIS — R0600 Dyspnea, unspecified: Secondary | ICD-10-CM | POA: Diagnosis not present

## 2021-08-12 DIAGNOSIS — J84112 Idiopathic pulmonary fibrosis: Secondary | ICD-10-CM | POA: Diagnosis not present

## 2021-08-12 DIAGNOSIS — J4520 Mild intermittent asthma, uncomplicated: Secondary | ICD-10-CM | POA: Diagnosis not present

## 2021-08-12 DIAGNOSIS — J301 Allergic rhinitis due to pollen: Secondary | ICD-10-CM | POA: Diagnosis not present

## 2021-08-12 DIAGNOSIS — J479 Bronchiectasis, uncomplicated: Secondary | ICD-10-CM | POA: Diagnosis not present

## 2021-08-13 DIAGNOSIS — J4520 Mild intermittent asthma, uncomplicated: Secondary | ICD-10-CM | POA: Diagnosis not present

## 2021-09-03 DIAGNOSIS — J45909 Unspecified asthma, uncomplicated: Secondary | ICD-10-CM | POA: Diagnosis not present

## 2021-09-04 DIAGNOSIS — J45909 Unspecified asthma, uncomplicated: Secondary | ICD-10-CM | POA: Diagnosis not present

## 2021-09-06 DIAGNOSIS — J45909 Unspecified asthma, uncomplicated: Secondary | ICD-10-CM | POA: Diagnosis not present

## 2021-09-09 DIAGNOSIS — J45909 Unspecified asthma, uncomplicated: Secondary | ICD-10-CM | POA: Diagnosis not present

## 2021-09-11 DIAGNOSIS — J45909 Unspecified asthma, uncomplicated: Secondary | ICD-10-CM | POA: Diagnosis not present

## 2021-09-13 DIAGNOSIS — J45909 Unspecified asthma, uncomplicated: Secondary | ICD-10-CM | POA: Diagnosis not present

## 2021-09-13 DIAGNOSIS — J4520 Mild intermittent asthma, uncomplicated: Secondary | ICD-10-CM | POA: Diagnosis not present

## 2021-09-16 DIAGNOSIS — J45909 Unspecified asthma, uncomplicated: Secondary | ICD-10-CM | POA: Diagnosis not present

## 2021-09-17 DIAGNOSIS — H2512 Age-related nuclear cataract, left eye: Secondary | ICD-10-CM | POA: Diagnosis not present

## 2021-09-18 DIAGNOSIS — J45909 Unspecified asthma, uncomplicated: Secondary | ICD-10-CM | POA: Diagnosis not present

## 2021-09-20 DIAGNOSIS — J45909 Unspecified asthma, uncomplicated: Secondary | ICD-10-CM | POA: Diagnosis not present

## 2021-09-23 DIAGNOSIS — J84112 Idiopathic pulmonary fibrosis: Secondary | ICD-10-CM | POA: Diagnosis not present

## 2021-09-23 DIAGNOSIS — J4520 Mild intermittent asthma, uncomplicated: Secondary | ICD-10-CM | POA: Diagnosis not present

## 2021-09-23 DIAGNOSIS — J479 Bronchiectasis, uncomplicated: Secondary | ICD-10-CM | POA: Diagnosis not present

## 2021-09-23 DIAGNOSIS — J301 Allergic rhinitis due to pollen: Secondary | ICD-10-CM | POA: Diagnosis not present

## 2021-09-23 DIAGNOSIS — J45909 Unspecified asthma, uncomplicated: Secondary | ICD-10-CM | POA: Diagnosis not present

## 2021-09-24 DIAGNOSIS — H2512 Age-related nuclear cataract, left eye: Secondary | ICD-10-CM | POA: Diagnosis not present

## 2021-09-24 DIAGNOSIS — K219 Gastro-esophageal reflux disease without esophagitis: Secondary | ICD-10-CM | POA: Diagnosis not present

## 2021-09-24 DIAGNOSIS — H259 Unspecified age-related cataract: Secondary | ICD-10-CM | POA: Diagnosis not present

## 2021-09-24 DIAGNOSIS — J449 Chronic obstructive pulmonary disease, unspecified: Secondary | ICD-10-CM | POA: Diagnosis not present

## 2021-09-24 DIAGNOSIS — H25812 Combined forms of age-related cataract, left eye: Secondary | ICD-10-CM | POA: Diagnosis not present

## 2021-09-24 DIAGNOSIS — Z87891 Personal history of nicotine dependence: Secondary | ICD-10-CM | POA: Diagnosis not present

## 2021-09-24 DIAGNOSIS — I10 Essential (primary) hypertension: Secondary | ICD-10-CM | POA: Diagnosis not present

## 2021-09-27 DIAGNOSIS — J45909 Unspecified asthma, uncomplicated: Secondary | ICD-10-CM | POA: Diagnosis not present

## 2021-09-30 DIAGNOSIS — J45909 Unspecified asthma, uncomplicated: Secondary | ICD-10-CM | POA: Diagnosis not present

## 2021-10-02 DIAGNOSIS — J45909 Unspecified asthma, uncomplicated: Secondary | ICD-10-CM | POA: Diagnosis not present

## 2021-10-04 DIAGNOSIS — J45909 Unspecified asthma, uncomplicated: Secondary | ICD-10-CM | POA: Diagnosis not present

## 2021-10-07 DIAGNOSIS — J45909 Unspecified asthma, uncomplicated: Secondary | ICD-10-CM | POA: Diagnosis not present

## 2021-10-09 DIAGNOSIS — J45909 Unspecified asthma, uncomplicated: Secondary | ICD-10-CM | POA: Diagnosis not present

## 2021-10-11 DIAGNOSIS — J45909 Unspecified asthma, uncomplicated: Secondary | ICD-10-CM | POA: Diagnosis not present

## 2021-10-13 DIAGNOSIS — J4520 Mild intermittent asthma, uncomplicated: Secondary | ICD-10-CM | POA: Diagnosis not present

## 2021-10-14 DIAGNOSIS — J45909 Unspecified asthma, uncomplicated: Secondary | ICD-10-CM | POA: Diagnosis not present

## 2021-10-16 DIAGNOSIS — J45909 Unspecified asthma, uncomplicated: Secondary | ICD-10-CM | POA: Diagnosis not present

## 2021-10-16 DIAGNOSIS — J449 Chronic obstructive pulmonary disease, unspecified: Secondary | ICD-10-CM | POA: Diagnosis not present

## 2021-10-18 DIAGNOSIS — J45909 Unspecified asthma, uncomplicated: Secondary | ICD-10-CM | POA: Diagnosis not present

## 2021-10-18 DIAGNOSIS — J449 Chronic obstructive pulmonary disease, unspecified: Secondary | ICD-10-CM | POA: Diagnosis not present

## 2021-10-21 DIAGNOSIS — J45909 Unspecified asthma, uncomplicated: Secondary | ICD-10-CM | POA: Diagnosis not present

## 2021-10-21 DIAGNOSIS — J449 Chronic obstructive pulmonary disease, unspecified: Secondary | ICD-10-CM | POA: Diagnosis not present

## 2021-10-23 DIAGNOSIS — J45909 Unspecified asthma, uncomplicated: Secondary | ICD-10-CM | POA: Diagnosis not present

## 2021-10-23 DIAGNOSIS — J449 Chronic obstructive pulmonary disease, unspecified: Secondary | ICD-10-CM | POA: Diagnosis not present

## 2021-10-25 DIAGNOSIS — J449 Chronic obstructive pulmonary disease, unspecified: Secondary | ICD-10-CM | POA: Diagnosis not present

## 2021-10-25 DIAGNOSIS — J45909 Unspecified asthma, uncomplicated: Secondary | ICD-10-CM | POA: Diagnosis not present

## 2021-10-28 DIAGNOSIS — J45909 Unspecified asthma, uncomplicated: Secondary | ICD-10-CM | POA: Diagnosis not present

## 2021-10-28 DIAGNOSIS — J449 Chronic obstructive pulmonary disease, unspecified: Secondary | ICD-10-CM | POA: Diagnosis not present

## 2021-10-30 DIAGNOSIS — J45909 Unspecified asthma, uncomplicated: Secondary | ICD-10-CM | POA: Diagnosis not present

## 2021-10-30 DIAGNOSIS — J449 Chronic obstructive pulmonary disease, unspecified: Secondary | ICD-10-CM | POA: Diagnosis not present

## 2021-11-01 DIAGNOSIS — J45909 Unspecified asthma, uncomplicated: Secondary | ICD-10-CM | POA: Diagnosis not present

## 2021-11-01 DIAGNOSIS — J449 Chronic obstructive pulmonary disease, unspecified: Secondary | ICD-10-CM | POA: Diagnosis not present

## 2021-11-05 DIAGNOSIS — J45909 Unspecified asthma, uncomplicated: Secondary | ICD-10-CM | POA: Diagnosis not present

## 2021-11-05 DIAGNOSIS — J449 Chronic obstructive pulmonary disease, unspecified: Secondary | ICD-10-CM | POA: Diagnosis not present

## 2021-11-06 DIAGNOSIS — J45909 Unspecified asthma, uncomplicated: Secondary | ICD-10-CM | POA: Diagnosis not present

## 2021-11-06 DIAGNOSIS — J449 Chronic obstructive pulmonary disease, unspecified: Secondary | ICD-10-CM | POA: Diagnosis not present

## 2021-11-11 DIAGNOSIS — J45909 Unspecified asthma, uncomplicated: Secondary | ICD-10-CM | POA: Diagnosis not present

## 2021-11-11 DIAGNOSIS — J449 Chronic obstructive pulmonary disease, unspecified: Secondary | ICD-10-CM | POA: Diagnosis not present

## 2021-11-13 DIAGNOSIS — J45909 Unspecified asthma, uncomplicated: Secondary | ICD-10-CM | POA: Diagnosis not present

## 2021-11-13 DIAGNOSIS — J449 Chronic obstructive pulmonary disease, unspecified: Secondary | ICD-10-CM | POA: Diagnosis not present

## 2021-11-14 DIAGNOSIS — J40 Bronchitis, not specified as acute or chronic: Secondary | ICD-10-CM | POA: Diagnosis not present

## 2021-11-14 DIAGNOSIS — J329 Chronic sinusitis, unspecified: Secondary | ICD-10-CM | POA: Diagnosis not present

## 2021-11-15 DIAGNOSIS — J45909 Unspecified asthma, uncomplicated: Secondary | ICD-10-CM | POA: Diagnosis not present

## 2021-11-18 DIAGNOSIS — J45909 Unspecified asthma, uncomplicated: Secondary | ICD-10-CM | POA: Diagnosis not present

## 2021-11-20 DIAGNOSIS — J45909 Unspecified asthma, uncomplicated: Secondary | ICD-10-CM | POA: Diagnosis not present

## 2021-11-22 DIAGNOSIS — J45909 Unspecified asthma, uncomplicated: Secondary | ICD-10-CM | POA: Diagnosis not present

## 2021-11-25 DIAGNOSIS — J45909 Unspecified asthma, uncomplicated: Secondary | ICD-10-CM | POA: Diagnosis not present

## 2021-11-25 DIAGNOSIS — G4733 Obstructive sleep apnea (adult) (pediatric): Secondary | ICD-10-CM | POA: Diagnosis not present

## 2021-11-26 DIAGNOSIS — J479 Bronchiectasis, uncomplicated: Secondary | ICD-10-CM | POA: Diagnosis not present

## 2021-11-26 DIAGNOSIS — J84112 Idiopathic pulmonary fibrosis: Secondary | ICD-10-CM | POA: Diagnosis not present

## 2021-11-26 DIAGNOSIS — J301 Allergic rhinitis due to pollen: Secondary | ICD-10-CM | POA: Diagnosis not present

## 2021-11-26 DIAGNOSIS — J4520 Mild intermittent asthma, uncomplicated: Secondary | ICD-10-CM | POA: Diagnosis not present

## 2021-11-26 DIAGNOSIS — G4733 Obstructive sleep apnea (adult) (pediatric): Secondary | ICD-10-CM | POA: Diagnosis not present

## 2021-11-27 DIAGNOSIS — J45909 Unspecified asthma, uncomplicated: Secondary | ICD-10-CM | POA: Diagnosis not present

## 2022-01-15 DIAGNOSIS — J301 Allergic rhinitis due to pollen: Secondary | ICD-10-CM | POA: Diagnosis not present

## 2022-01-15 DIAGNOSIS — J84112 Idiopathic pulmonary fibrosis: Secondary | ICD-10-CM | POA: Diagnosis not present

## 2022-01-15 DIAGNOSIS — J479 Bronchiectasis, uncomplicated: Secondary | ICD-10-CM | POA: Diagnosis not present

## 2022-01-15 DIAGNOSIS — J4520 Mild intermittent asthma, uncomplicated: Secondary | ICD-10-CM | POA: Diagnosis not present

## 2022-01-15 DIAGNOSIS — G4733 Obstructive sleep apnea (adult) (pediatric): Secondary | ICD-10-CM | POA: Diagnosis not present

## 2022-01-29 DIAGNOSIS — J301 Allergic rhinitis due to pollen: Secondary | ICD-10-CM | POA: Diagnosis not present

## 2022-01-29 DIAGNOSIS — G4733 Obstructive sleep apnea (adult) (pediatric): Secondary | ICD-10-CM | POA: Diagnosis not present

## 2022-01-29 DIAGNOSIS — J84112 Idiopathic pulmonary fibrosis: Secondary | ICD-10-CM | POA: Diagnosis not present

## 2022-01-29 DIAGNOSIS — J4520 Mild intermittent asthma, uncomplicated: Secondary | ICD-10-CM | POA: Diagnosis not present

## 2022-01-29 DIAGNOSIS — J479 Bronchiectasis, uncomplicated: Secondary | ICD-10-CM | POA: Diagnosis not present

## 2022-02-19 DIAGNOSIS — J4520 Mild intermittent asthma, uncomplicated: Secondary | ICD-10-CM | POA: Diagnosis not present

## 2022-02-21 DIAGNOSIS — I75023 Atheroembolism of bilateral lower extremities: Secondary | ICD-10-CM | POA: Diagnosis not present

## 2022-02-21 DIAGNOSIS — J9611 Chronic respiratory failure with hypoxia: Secondary | ICD-10-CM | POA: Diagnosis not present

## 2022-02-21 DIAGNOSIS — R079 Chest pain, unspecified: Secondary | ICD-10-CM | POA: Diagnosis not present

## 2022-03-26 DIAGNOSIS — J479 Bronchiectasis, uncomplicated: Secondary | ICD-10-CM | POA: Diagnosis not present

## 2022-03-26 DIAGNOSIS — J84112 Idiopathic pulmonary fibrosis: Secondary | ICD-10-CM | POA: Diagnosis not present

## 2022-03-26 DIAGNOSIS — J301 Allergic rhinitis due to pollen: Secondary | ICD-10-CM | POA: Diagnosis not present

## 2022-03-26 DIAGNOSIS — J4520 Mild intermittent asthma, uncomplicated: Secondary | ICD-10-CM | POA: Diagnosis not present

## 2022-03-26 DIAGNOSIS — G4733 Obstructive sleep apnea (adult) (pediatric): Secondary | ICD-10-CM | POA: Diagnosis not present

## 2022-03-29 DIAGNOSIS — S20219A Contusion of unspecified front wall of thorax, initial encounter: Secondary | ICD-10-CM | POA: Diagnosis not present

## 2022-03-29 DIAGNOSIS — R0782 Intercostal pain: Secondary | ICD-10-CM | POA: Diagnosis not present

## 2022-04-07 ENCOUNTER — Other Ambulatory Visit: Payer: Self-pay | Admitting: *Deleted

## 2022-04-07 DIAGNOSIS — L819 Disorder of pigmentation, unspecified: Secondary | ICD-10-CM

## 2022-04-08 DIAGNOSIS — R531 Weakness: Secondary | ICD-10-CM | POA: Diagnosis not present

## 2022-04-08 DIAGNOSIS — R0789 Other chest pain: Secondary | ICD-10-CM | POA: Diagnosis not present

## 2022-04-08 DIAGNOSIS — Z043 Encounter for examination and observation following other accident: Secondary | ICD-10-CM | POA: Diagnosis not present

## 2022-04-08 DIAGNOSIS — J479 Bronchiectasis, uncomplicated: Secondary | ICD-10-CM | POA: Diagnosis not present

## 2022-04-08 DIAGNOSIS — J439 Emphysema, unspecified: Secondary | ICD-10-CM | POA: Diagnosis not present

## 2022-04-08 DIAGNOSIS — J432 Centrilobular emphysema: Secondary | ICD-10-CM | POA: Diagnosis not present

## 2022-04-08 DIAGNOSIS — R079 Chest pain, unspecified: Secondary | ICD-10-CM | POA: Diagnosis not present

## 2022-04-08 DIAGNOSIS — S20212A Contusion of left front wall of thorax, initial encounter: Secondary | ICD-10-CM | POA: Diagnosis not present

## 2022-04-21 ENCOUNTER — Ambulatory Visit (HOSPITAL_COMMUNITY)
Admission: RE | Admit: 2022-04-21 | Discharge: 2022-04-21 | Disposition: A | Discharge status: Home / Self Care | Payer: Medicare PPO | Source: Ambulatory Visit | Attending: Surgery | Admitting: Surgery

## 2022-04-21 ENCOUNTER — Ambulatory Visit: Payer: Medicare PPO | Admitting: Surgery

## 2022-04-21 ENCOUNTER — Encounter: Payer: Self-pay | Admitting: Surgery

## 2022-04-21 VITALS — BP 111/82 | HR 48 | Temp 98.0°F | Resp 20 | Ht 69.0 in | Wt 199.0 lb

## 2022-04-21 DIAGNOSIS — L819 Disorder of pigmentation, unspecified: Secondary | ICD-10-CM | POA: Diagnosis not present

## 2022-04-21 DIAGNOSIS — M7989 Other specified soft tissue disorders: Secondary | ICD-10-CM | POA: Diagnosis not present

## 2022-04-21 NOTE — Progress Notes (Signed)
? ?[facility] [place] ? ?Patient name: Joshua Grant MRN: [id number] DOB: April 10, 1951 Sex: male ? ? ?REQUESTING PROVIDER:  ? ? Clydie Braun ? ? ?REASON FOR CONSULT:  ?  ?Feet discoloration ? ?HISTORY OF PRESENT ILLNESS:  ? ?Joshua Grant is a 71 y.o. male, who is referred for evaluation of bluish discoloration of both feet.  He states this happened recently after he had a fall.  He is also noted worsening swelling.  He does not have any discomfort.  He does not have any open wounds. ? ?The patient has pulmonary fibrosis and is on home oxygen.  He is medically managed for hypertension.  He is a former smoker. ? ?PAST MEDICAL HISTORY  ? ? ?Past Medical History:  ?Diagnosis Date  ? Angio-edema   ? Asthma   ? High blood pressure   ? ? ? ?FAMILY HISTORY  ? ?Family History  ?Problem Relation Age of Onset  ? COPD Brother   ?     smoker  ? CAD Father   ?     MI in his 21's  ? ? ?SOCIAL HISTORY:  ? ?Social History  ? ?Socioeconomic History  ? Marital status: Married  ?  Spouse name: Not on file  ? Number of children: Not on file  ? Years of education: Not on file  ? Highest education level: Not on file  ?Occupational History  ? Not on file  ?Tobacco Use  ? Smoking status: Former  ?  Packs/day: 1.00  ?  Years: 30.00  ?  Pack years: 30.00  ?  Types: Cigarettes  ?  Quit date: 12/15/2010  ?  Years since quitting: 11.3  ? Smokeless tobacco: Never  ?Vaping Use  ? Vaping Use: Never used  ?Substance and Sexual Activity  ? Alcohol use: Yes  ?  Comment: 1 a day  ? Drug use: Not Currently  ? Sexual activity: Not on file  ?Other Topics Concern  ? Not on file  ?Social History Narrative  ? Not on file  ? ?Social Determinants of Health  ? ?Financial Resource Strain: Not on file  ?Food Insecurity: Not on file  ?Transportation Needs: Not on file  ?Physical Activity: Not on file  ?Stress: Not on file  ?Social Connections: Not on file  ?Intimate Partner Violence: Not on file   ? ? ?ALLERGIES:  ? ? ?Allergies  ?Allergen Reactions  ? Amoxicillin-Pot Clavulanate Nausea And Vomiting  ? Levaquin [Levofloxacin]   ?  Joint pain  ? ? ?CURRENT MEDICATIONS:  ? ? ?Current Outpatient Medications  ?Medication Sig Dispense Refill  ? albuterol (VENTOLIN HFA) 108 (90 Base) MCG/ACT inhaler Can inhale two puffs every four to six hours as needed for cough or wheeze.  Can use 15 minutes prior to exercise if needed. 18 g 0  ? azelastine (ASTELIN) 0.1 % nasal spray Place 1 spray into both nostrils 2 (two) times daily. USE ALONG WITH FLONASE 30 mL 5  ? fluticasone (FLONASE) 50 MCG/ACT nasal spray Place 2 sprays into both nostrils 3 times/day as needed-between meals & bedtime for allergies or rhinitis.    ? loratadine (CLARITIN) 10 MG tablet Take 1-2 tablets 1 time per day as needed. 60 tablet 1  ? losartan (COZAAR) 25 MG tablet Take 25 mg by mouth daily. for high blood pressure  12  ? mometasone (ASMANEX) 220 MCG/INH inhaler Inhale 1 puff into the lungs daily. 1 Inhaler 1  ? montelukast (SINGULAIR) 10 MG tablet  TAKE 1 TABLET BY MOUTH EVERYDAY AT BEDTIME 90 tablet 0  ? pantoprazole (PROTONIX) 40 MG tablet TAKE 1 TABLET (40 MG TOTAL) BY MOUTH DAILY. TAKE 30-60 MIN BEFORE FIRST MEAL OF THE DAY 90 tablet 0  ? ?No current facility-administered medications for this visit.  ? ? ?REVIEW OF SYSTEMS:  ? ?'[X]'$  denotes positive finding, '[ ]'$  denotes negative finding ?Cardiac  Comments:  ?Chest pain or chest pressure:    ?Shortness of breath upon exertion:    ?Short of breath when lying flat:    ?Irregular heart rhythm:    ?    ?Vascular    ?Pain in calf, thigh, or hip brought on by ambulation:    ?Pain in feet at night that wakes you up from your sleep:     ?Blood clot in your veins:    ?Leg swelling:  x   ?    ?Pulmonary    ?Oxygen at home:    ?Productive cough:     ?Wheezing:     ?    ?Neurologic    ?Sudden weakness in arms or legs:     ?Sudden numbness in arms or legs:     ?Sudden onset of difficulty speaking or slurred  speech:    ?Temporary loss of vision in one eye:     ?Problems with dizziness:     ?    ?Gastrointestinal    ?Blood in stool:     ? ?Vomited blood:     ?    ?Genitourinary    ?Burning when urinating:     ?Blood in urine:    ?    ?Psychiatric    ?Major depression:     ?    ?Hematologic    ?Bleeding problems:    ?Problems with blood clotting too easily:    ?    ?Skin    ?Rashes or ulcers:    ?    ?Constitutional    ?Fever or chills:    ? ?PHYSICAL EXAM:  ? ?There were no vitals filed for this visit. ? ?GENERAL: The patient is a well-nourished male, in no acute distress. The vital signs are documented above. ?CARDIAC: There is a regular rate and rhythm.  ?VASCULAR: 2+ bilateral edema.  Bluish discoloration on the dorsum and plantar surface of the foot ?PULMONARY: Nonlabored respirations ?MUSCULOSKELETAL: There are no major deformities or cyanosis. ?NEUROLOGIC: No focal weakness or paresthesias are detected. ?SKIN: There are no ulcers or rashes noted. ?PSYCHIATRIC: The patient has a normal affect. ? ?STUDIES:  ? ?I have reviewed the following: ?ABI Findings:  ?+---------+------------------+-----+--------+--------+  ?Right    Rt Pressure (mmHg)IndexWaveformComment   ?+---------+------------------+-----+--------+--------+  ?Brachial 115                                      ?+---------+------------------+-----+--------+--------+  ?PTA      119               1.03                   ?+---------+------------------+-----+--------+--------+  ?DP       112               0.97                   ?+---------+------------------+-----+--------+--------+  ?Great [id number]                0.84                   ?+---------+------------------+-----+--------+--------+  ? ?+---------+------------------+-----+--------+-------+  ?  Left     Lt Pressure (mmHg)IndexWaveformComment  ?+---------+------------------+-----+--------+-------+  ?Brachial 116                                      ?+---------+------------------+-----+--------+-------+  ?PTA      133               1.15                  ?+---------+------------------+-----+--------+-------+  ?DP       102               0.88                  ?+---------+------------------+-----+--------+-------+  ?Great [id number]               1.17                  ?+---------+------------------+-----+--------+-------+  ? ?ASSESSMENT and PLAN  ? ?Bluish discoloration of both feet: Fortunately, the patient's noninvasive arterial vascular lab imaging studies were normal.  I do not think that he has arterial insufficiency.  Most likely the discoloration is secondary to venous congestion, given the amount of edema that he has.  I have recommended wearing compression socks and keeping his legs elevated.  He may also benefit from diuresis. ? ? ?Annamarie Major, IV, MD, FACS ?[facility] [place] ?Tel [phone] ?Pager [phone]  ?

## 2022-05-06 DIAGNOSIS — Z9981 Dependence on supplemental oxygen: Secondary | ICD-10-CM | POA: Diagnosis not present

## 2022-05-06 DIAGNOSIS — J9611 Chronic respiratory failure with hypoxia: Secondary | ICD-10-CM | POA: Diagnosis not present

## 2022-05-06 DIAGNOSIS — I2584 Coronary atherosclerosis due to calcified coronary lesion: Secondary | ICD-10-CM | POA: Diagnosis not present

## 2022-05-06 DIAGNOSIS — R609 Edema, unspecified: Secondary | ICD-10-CM | POA: Diagnosis not present

## 2022-05-06 DIAGNOSIS — J470 Bronchiectasis with acute lower respiratory infection: Secondary | ICD-10-CM | POA: Diagnosis not present

## 2022-05-06 DIAGNOSIS — J982 Interstitial emphysema: Secondary | ICD-10-CM | POA: Diagnosis not present

## 2022-05-06 DIAGNOSIS — E785 Hyperlipidemia, unspecified: Secondary | ICD-10-CM | POA: Diagnosis not present

## 2022-05-06 DIAGNOSIS — I2510 Atherosclerotic heart disease of native coronary artery without angina pectoris: Secondary | ICD-10-CM | POA: Diagnosis not present

## 2022-05-06 DIAGNOSIS — I700 Atherosclerosis of aorta: Secondary | ICD-10-CM | POA: Diagnosis not present

## 2022-05-15 ENCOUNTER — Ambulatory Visit: Payer: Medicare PPO | Admitting: Pulmonary Disease

## 2022-05-15 ENCOUNTER — Other Ambulatory Visit: Payer: Medicare PPO

## 2022-05-15 ENCOUNTER — Encounter: Payer: Self-pay | Admitting: Pulmonary Disease

## 2022-05-15 VITALS — BP 132/84 | HR 115 | Ht 69.0 in | Wt 185.0 lb

## 2022-05-15 DIAGNOSIS — J84112 Idiopathic pulmonary fibrosis: Secondary | ICD-10-CM | POA: Diagnosis not present

## 2022-05-15 DIAGNOSIS — J449 Chronic obstructive pulmonary disease, unspecified: Secondary | ICD-10-CM | POA: Diagnosis not present

## 2022-05-15 MED ORDER — SYMBICORT 80-4.5 MCG/ACT IN AERO: 2.0000 | INHALATION_SPRAY | Freq: Two times a day (BID) | RESPIRATORY_TRACT | 11 refills | Status: DC

## 2022-05-15 NOTE — Progress Notes (Signed)
Synopsis: Referred in June 2023 for second opinion on lung diseae by Christa See, MD  Subjective:   PATIENT ID: Joshua Grant GENDER: male DOB: 09-Jan-1951, MRN: [id number]   HPI  Chief Complaint  Patient presents with   Consult    Referred by PCP for SOB. Currently on 2L of O2. Had been seen by Dr. Alcide Clever before and was told that he has IPF.    Joshua Grant is a 71 year old male, former smoker with history of hypertension who is referred to pulmonary clinic for a second opinion on his shortness of breath.   He has been followed at [facility] for shortness of breath. He has been diagnosed with pulmonary fibrosis based on CT chest scans over recent years. He was also prescribed anti-fibrotic therapy which he did not tolerated due to GI side effects. He is currently using 2L of O2.   He is very limited in his physical activity due to shortness of breath. He has intermittent cough with occasional sputum production. He denies wheezing. He is using symbicort 80-4.36mg 2 puffs twice daily. He is using azelastine and fluticasone for nasal congestion and drainage. He is taking montelukast '10mg'$  daily. He is taking pantoprazole for GERD.   He is a former smoker with around a 30 pack year history. He had second hand smoke exposure growing up. He worked in [employer] 25 years with significant dust exposure. 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   His mother had lung cancer at 563 His father had pancreatic cancer.   Past Medical History:  Diagnosis Date   Angio-edema    Asthma    High blood pressure      Family History  Problem Relation Age of Onset   COPD Brother        smoker   CAD Father        MI in his 413's    Social History   Socioeconomic History   Marital status: Married     Spouse name: Not on file   Number of children: Not on file   Years of education: Not on file   Highest education level: Not on file  Occupational History   Not on file  Tobacco Use   Smoking status: Former    Packs/day: 1.00    Years: 30.00    Total pack years: 30.00    Types: Cigarettes    Quit date: 12/15/2010    Years since quitting: 11.4   Smokeless tobacco: Never  Vaping Use   Vaping Use: Never used  Substance and Sexual Activity   Alcohol use: Yes    Comment: 1 a day   Drug use: Not Currently   Sexual activity: Not on file  Other Topics Concern   Not on file  Social History Narrative   Not on file   Social Determinants of Health   Financial Resource Strain: Not on file  Food Insecurity: Not on file  Transportation Needs: Not on file  Physical Activity: Not on file  Stress: Not on file  Social Connections: Not on file  Intimate Partner Violence: Not on file     Allergies  Allergen Reactions   Amoxicillin-Pot Clavulanate Nausea And Vomiting   Levaquin [Levofloxacin]     Joint pain     Outpatient Medications Prior to  Visit  Medication Sig Dispense Refill   albuterol (VENTOLIN HFA) 108 (90 Base) MCG/ACT inhaler Can inhale two puffs every four to six hours as needed for cough or wheeze.  Can use 15 minutes prior to exercise if needed. 18 g 0   ALPRAZolam (XANAX) 0.25 MG tablet Take 0.25 mg by mouth 3 (three) times daily.     azelastine (ASTELIN) 0.1 % nasal spray Place 1 spray into both nostrils 2 (two) times daily. USE ALONG WITH FLONASE 30 mL 5   fluticasone (FLONASE) 50 MCG/ACT nasal spray Place 2 sprays into both nostrils 3 times/day as needed-between meals & bedtime for allergies or rhinitis.     furosemide (LASIX) 40 MG tablet Take 20-40 mg by mouth daily as needed.     KLOR-CON M10 10 MEQ tablet Take 10 mEq by mouth daily.     loratadine (CLARITIN) 10 MG tablet Take 1-2 tablets 1 time per day as needed. 60 tablet 1   losartan (COZAAR) 25 MG tablet Take  25 mg by mouth daily. for high blood pressure  12   montelukast (SINGULAIR) 10 MG tablet TAKE 1 TABLET BY MOUTH EVERYDAY AT BEDTIME 90 tablet 0   pantoprazole (PROTONIX) 40 MG tablet TAKE 1 TABLET (40 MG TOTAL) BY MOUTH DAILY. TAKE 30-60 MIN BEFORE FIRST MEAL OF THE DAY 90 tablet 0   SYMBICORT 80-4.5 MCG/ACT inhaler Inhale 2 puffs into the lungs in the morning and at bedtime.     mometasone (ASMANEX) 220 MCG/INH inhaler Inhale 1 puff into the lungs daily. 1 Inhaler 1   No facility-administered medications prior to visit.   Review of Systems  Constitutional:  Negative for chills, fever, malaise/fatigue and weight loss.  HENT:  Positive for congestion. Negative for sinus pain and sore throat.   Eyes: Negative.   Respiratory:  Positive for cough, sputum production and shortness of breath. Negative for hemoptysis and wheezing.   Cardiovascular:  Negative for chest pain, palpitations, orthopnea, claudication and leg swelling.  Gastrointestinal:  Positive for abdominal pain and heartburn. Negative for nausea and vomiting.  Genitourinary: Negative.   Musculoskeletal:  Negative for joint pain and myalgias.  Skin:  Positive for itching. Negative for rash.  Neurological:  Negative for weakness.  Endo/Heme/Allergies: Negative.   Psychiatric/Behavioral:  The patient is nervous/anxious.    Objective:   Vitals:   05/15/22 1007  BP: 132/84  Pulse: (!) 115  SpO2: 94%  Weight: 185 lb (83.9 kg)  Height: '5\' 9"'$  (1.753 m)   Physical Exam Constitutional:      General: He is not in acute distress. HENT:     Head: Normocephalic and atraumatic.  Eyes:     Extraocular Movements: Extraocular movements intact.     Conjunctiva/sclera: Conjunctivae normal.     Pupils: Pupils are equal, round, and reactive to light.  Cardiovascular:     Rate and Rhythm: Normal rate and regular rhythm.     Pulses: Normal pulses.     Heart sounds: Normal heart sounds. No murmur heard. Pulmonary:     Effort: Pulmonary  effort is normal.     Breath sounds: Rales (bases) present.  Abdominal:     General: Bowel sounds are normal.     Palpations: Abdomen is soft.  Musculoskeletal:     Right lower leg: No edema.     Left lower leg: No edema.  Lymphadenopathy:     Cervical: No cervical adenopathy.  Skin:    General: Skin is warm and dry.  Neurological:  General: No focal deficit present.     Mental Status: He is alert.  Psychiatric:        Mood and Affect: Mood normal.        Behavior: Behavior normal.        Thought Content: Thought content normal.        Judgment: Judgment normal.     CBC    Component Value Date/Time   WBC 8.7 01/29/2018 1707   RBC 4.96 01/29/2018 1707   HGB 15.5 01/29/2018 1707   HCT 45.8 01/29/2018 1707   PLT 375.0 01/29/2018 1707   MCV 92.4 01/29/2018 1707   MCHC 33.8 01/29/2018 1707   RDW 13.4 01/29/2018 1707   LYMPHSABS 1.8 01/29/2018 1707   MONOABS 1.2 (H) 01/29/2018 1707   EOSABS 0.3 01/29/2018 1707   BASOSABS 0.1 01/29/2018 1707      Latest Ref Rng & Units 07/19/2018   10:44 AM  BMP  Glucose 65 - 99 mg/dL 104   BUN 8 - 27 mg/dL 12   Creatinine 0.76 - 1.27 mg/dL 1.05   BUN/Creat Ratio 10 - 24 11   Sodium 134 - 144 mmol/L 138   Potassium 3.5 - 5.2 mmol/L 4.7   Chloride 96 - 106 mmol/L 105   CO2 20 - 29 mmol/L 21   Calcium 8.6 - 10.2 mg/dL 9.5    Chest imaging: CT Chest 03/2022 Mediastinum/Nodes: There are multiple prominent mediastinal lymph nodes which are unchanged from the prior exam and likely reactive. The thyroid is unremarkable. The esophagus is unremarkable.  Lungs/Pleura: There is mild centrilobular and paraseptal emphysema. Unchanged basilar predominant fibrotic interstitial lung disease with subpleural reticulation, interlobular septal thickening and traction bronchiectasis without definite honeycombing. There is no new airspace disease. No suspicious pulmonary nodules. Biapical pleuroparenchymal scarring. No pleural effusion. No  pneumothorax.   HRCT Chest 04/2021 1. Redemonstrated mild pulmonary fibrosis in a pattern with apical to basal gradient, featuring irregular peripheral interstitial opacity, septal thickening, areas of subpleural bronchiolectasis, and without associated honeycombing. Scattered, associated ground-glass. Fibrotic findings are not significantly changed compared to immediate prior examination dated 10/09/2020, but as previously noted are clearly worsened over time on sequential prior examinations dating back to 08/07/2016. Findings are consistent with a "probable UIP" pattern by pulmonary fibrosis criteria. Findings are categorized as probable UIP per consensus guidelines: Diagnosis of Idiopathic Pulmonary Fibrosis: An Official ATS/ERS/JRS/ALAT Clinical Practice Guideline. [place], Iss 5, [phone], Aug 15 2017. 2. Unchanged prominent mediastinal lymph nodes, likely reactive. 3. Emphysema. 4. Coronary artery disease.   PFT:     No data to display          Labs:  Path:  Echo 2019: LV EF 55-60%. Grade I diastolic dysfunction. RV is mildly dilated. RA is mildly dilated.  Heart Catheterization:  Assessment & Plan:   IPF (idiopathic pulmonary fibrosis) (HCC) - Plan: SYMBICORT 80-4.5 MCG/ACT inhaler, ANCA screen with reflex titer, Anti-DNA antibody, double-stranded, Sjogren's syndrome antibods(ssa + ssb), RNP Antibodies, Rheumatoid factor, Hypersensitivity Pneumonitis, Cyclic citrul peptide antibody, IgG, Anti-scleroderma antibody, Anti-Smith antibody, Centromere Antibodies, ANA, MyoMarker 3 Plus Profile (RDL)  Chronic obstructive pulmonary disease, unspecified COPD type (HCC)  Discussion: Joshua Grant is a 71 year old male, former smoker with history of hypertension who is referred to pulmonary clinic for a second opinion on his shortness of breath.   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 We reviewed the CT scans together.  He did not tolerate previous trial of antifibrotic therapy due to GI side effects. We discussed that we do not have any other treatment options at this time. We discussed performing an inflammatory lab work up and that could lead to potential therapy with immune suppressant/anti-inflammatory medications and he would like to move forward with this. Overall, he wishes to focus on quality of life.   We will place order for POC.   We will request records from Dr. Maxie Barb office.   Follow up in 2 months.  Freda Jackson, MD [facility] Office: [phone]    Current Outpatient Medications:    albuterol (VENTOLIN HFA) 108 (90 Base) MCG/ACT inhaler, Can inhale two puffs every four to six hours as needed for cough or wheeze.  Can use 15 minutes prior to exercise if needed., Disp: 18 g, Rfl: 0   ALPRAZolam (XANAX) 0.25 MG tablet, Take 0.25 mg by mouth 3 (three) times daily., Disp: , Rfl:    azelastine (ASTELIN) 0.1 % nasal spray, Place 1 spray into both nostrils 2 (two) times daily. USE ALONG WITH FLONASE, Disp: 30 mL, Rfl: 5   fluticasone (FLONASE) 50 MCG/ACT nasal spray, Place 2 sprays into both nostrils 3 times/day as needed-between meals & bedtime for allergies or rhinitis., Disp: , Rfl:    furosemide (LASIX) 40 MG tablet, Take 20-40 mg by mouth daily as needed., Disp: , Rfl:    KLOR-CON M10 10 MEQ tablet, Take 10 mEq by mouth daily., Disp: , Rfl:    loratadine (CLARITIN) 10 MG tablet, Take 1-2 tablets 1 time per day as needed., Disp: 60 tablet, Rfl: 1   losartan (COZAAR) 25 MG tablet, Take 25 mg by mouth daily. for high blood pressure, Disp: , Rfl: 12   montelukast (SINGULAIR) 10 MG tablet, TAKE 1 TABLET BY MOUTH EVERYDAY AT BEDTIME, Disp: 90 tablet, Rfl: 0   pantoprazole (PROTONIX) 40 MG tablet, TAKE 1 TABLET (40 MG TOTAL) BY  MOUTH DAILY. TAKE 30-60 MIN BEFORE FIRST MEAL OF THE DAY, Disp: 90 tablet, Rfl: 0   SYMBICORT 80-4.5 MCG/ACT inhaler, Inhale 2 puffs into the lungs in the morning and at bedtime., Disp: 1 each, Rfl: 11

## 2022-05-15 NOTE — Patient Instructions (Addendum)
We will check inflammatory labs today at the [facility] lab  We will request records from Dr. Maxie Barb Office  Continue symbicort 2 puffs twice daily and as needed albuterol  We will place order to test you for a portable oxygen concentrator  Follow up in 2 months

## 2022-05-21 LAB — SJOGREN'S SYNDROME ANTIBODS(SSA + SSB)
SSA (Ro) (ENA) Antibody, IgG: <1 AI
SSB (La) (ENA) Antibody, IgG: <1 AI

## 2022-05-21 LAB — RHEUMATOID FACTOR: Rheumatoid fact SerPl-aCnc: <14 IU/mL (ref <14)

## 2022-05-21 LAB — CYCLIC CITRUL PEPTIDE ANTIBODY, IGG: Cyclic Citrullin Peptide Ab: <16 UNITS

## 2022-05-21 LAB — CENTROMERE ANTIBODIES: Centromere Ab Screen: <1 AI

## 2022-05-21 LAB — ANA: Anti Nuclear Antibody (ANA): NEGATIVE

## 2022-05-21 LAB — ANCA SCREEN W REFLEX TITER: ANCA SCREEN: NEGATIVE

## 2022-05-21 LAB — ANTI-DNA ANTIBODY, DOUBLE-STRANDED: ds DNA Ab: 2 IU/mL

## 2022-05-21 LAB — ANTI-SCLERODERMA ANTIBODY: Scleroderma (Scl-70) (ENA) Antibody, IgG: <1 AI

## 2022-05-21 LAB — ANTI-SMITH ANTIBODY: ENA SM Ab Ser-aCnc: <1 AI

## 2022-05-23 ENCOUNTER — Encounter: Payer: Self-pay | Admitting: Pulmonary Disease

## 2022-06-07 DIAGNOSIS — I700 Atherosclerosis of aorta: Secondary | ICD-10-CM | POA: Diagnosis not present

## 2022-06-07 DIAGNOSIS — N281 Cyst of kidney, acquired: Secondary | ICD-10-CM | POA: Diagnosis not present

## 2022-06-07 DIAGNOSIS — D751 Secondary polycythemia: Secondary | ICD-10-CM | POA: Diagnosis not present

## 2022-06-07 DIAGNOSIS — I3139 Other pericardial effusion (noninflammatory): Secondary | ICD-10-CM | POA: Diagnosis not present

## 2022-06-07 DIAGNOSIS — K649 Unspecified hemorrhoids: Secondary | ICD-10-CM | POA: Diagnosis not present

## 2022-06-07 DIAGNOSIS — Z8546 Personal history of malignant neoplasm of prostate: Secondary | ICD-10-CM | POA: Diagnosis not present

## 2022-06-07 DIAGNOSIS — N390 Urinary tract infection, site not specified: Secondary | ICD-10-CM | POA: Diagnosis not present

## 2022-06-07 DIAGNOSIS — J984 Other disorders of lung: Secondary | ICD-10-CM | POA: Diagnosis not present

## 2022-06-07 DIAGNOSIS — J439 Emphysema, unspecified: Secondary | ICD-10-CM | POA: Diagnosis not present

## 2022-06-07 DIAGNOSIS — N200 Calculus of kidney: Secondary | ICD-10-CM | POA: Diagnosis not present

## 2022-06-07 DIAGNOSIS — Z79899 Other long term (current) drug therapy: Secondary | ICD-10-CM | POA: Diagnosis not present

## 2022-06-07 DIAGNOSIS — J449 Chronic obstructive pulmonary disease, unspecified: Secondary | ICD-10-CM | POA: Diagnosis not present

## 2022-06-07 DIAGNOSIS — J849 Interstitial pulmonary disease, unspecified: Secondary | ICD-10-CM | POA: Diagnosis not present

## 2022-06-07 DIAGNOSIS — R319 Hematuria, unspecified: Secondary | ICD-10-CM | POA: Diagnosis not present

## 2022-06-10 LAB — HYPERSENSITIVITY PNEUMONITIS
A. Pullulans Abs: NEGATIVE
A.Fumigatus #1 Abs: NEGATIVE
Micropolyspora faeni, IgG: NEGATIVE
Pigeon Serum Abs: NEGATIVE
Thermoact. Saccharii: NEGATIVE
Thermoactinomyces vulgaris, IgG: NEGATIVE

## 2022-06-10 LAB — RNP ANTIBODIES: ENA RNP Ab: <0.2 AI (ref 0.0–0.9)

## 2022-06-10 LAB — MYOMARKER 3 PLUS PROFILE (RDL)

## 2022-06-24 DIAGNOSIS — J449 Chronic obstructive pulmonary disease, unspecified: Secondary | ICD-10-CM | POA: Diagnosis not present

## 2022-06-24 DIAGNOSIS — Z7409 Other reduced mobility: Secondary | ICD-10-CM | POA: Diagnosis not present

## 2022-06-24 DIAGNOSIS — J8410 Pulmonary fibrosis, unspecified: Secondary | ICD-10-CM | POA: Diagnosis not present

## 2022-06-24 DIAGNOSIS — G4733 Obstructive sleep apnea (adult) (pediatric): Secondary | ICD-10-CM | POA: Diagnosis not present

## 2022-06-24 DIAGNOSIS — R609 Edema, unspecified: Secondary | ICD-10-CM | POA: Diagnosis not present

## 2022-06-24 DIAGNOSIS — I10 Essential (primary) hypertension: Secondary | ICD-10-CM | POA: Diagnosis not present

## 2022-06-24 DIAGNOSIS — K219 Gastro-esophageal reflux disease without esophagitis: Secondary | ICD-10-CM | POA: Diagnosis not present

## 2022-06-24 DIAGNOSIS — F419 Anxiety disorder, unspecified: Secondary | ICD-10-CM | POA: Diagnosis not present

## 2022-06-24 DIAGNOSIS — Z720 Tobacco use: Secondary | ICD-10-CM | POA: Diagnosis not present

## 2022-07-17 ENCOUNTER — Other Ambulatory Visit: Payer: Self-pay | Admitting: Pulmonary Disease

## 2022-07-17 ENCOUNTER — Encounter: Payer: Self-pay | Admitting: Pulmonary Disease

## 2022-07-17 ENCOUNTER — Ambulatory Visit: Payer: Medicare PPO | Admitting: Pulmonary Disease

## 2022-07-17 VITALS — BP 110/72 | HR 102

## 2022-07-17 DIAGNOSIS — J449 Chronic obstructive pulmonary disease, unspecified: Secondary | ICD-10-CM

## 2022-07-17 DIAGNOSIS — J84112 Idiopathic pulmonary fibrosis: Secondary | ICD-10-CM | POA: Diagnosis not present

## 2022-07-17 DIAGNOSIS — B370 Candidal stomatitis: Secondary | ICD-10-CM

## 2022-07-17 LAB — COMPREHENSIVE METABOLIC PANEL
ALT: 19 U/L (ref 0–53)
AST: 29 U/L (ref 0–37)
Albumin: 3.7 g/dL (ref 3.5–5.2)
Alkaline Phosphatase: 122 U/L — ABNORMAL HIGH (ref 39–117)
BUN: 15 mg/dL (ref 6–23)
CO2: 24 mEq/L (ref 19–32)
Calcium: 9.8 mg/dL (ref 8.4–10.5)
Chloride: 105 mEq/L (ref 96–112)
Creatinine, Ser: 1.23 mg/dL (ref 0.40–1.50)
GFR: 59.13 mL/min — ABNORMAL LOW (ref >60.00)
Glucose, Bld: 120 mg/dL — ABNORMAL HIGH (ref 70–99)
Potassium: 3.8 mEq/L (ref 3.5–5.1)
Sodium: 137 mEq/L (ref 135–145)
Total Bilirubin: 2.3 mg/dL — ABNORMAL HIGH (ref 0.2–1.2)
Total Protein: 7.8 g/dL (ref 6.0–8.3)

## 2022-07-17 MED ORDER — NYSTATIN 100000 UNIT/ML MT SUSP: 5.0000 mL | Freq: Four times a day (QID) | OROMUCOSAL | 0 refills | Status: AC

## 2022-07-17 MED ORDER — SYMBICORT 80-4.5 MCG/ACT IN AERO: 2.0000 | INHALATION_SPRAY | Freq: Two times a day (BID) | RESPIRATORY_TRACT | 11 refills | Status: AC

## 2022-07-17 MED ORDER — BREZTRI AEROSPHERE 160-9-4.8 MCG/ACT IN AERO: 2.0000 | INHALATION_SPRAY | Freq: Two times a day (BID) | RESPIRATORY_TRACT | 0 refills | Status: AC

## 2022-07-17 NOTE — Patient Instructions (Addendum)
We will request sleep study from [facility] in order to consider ordering you a CPAP or Bipap machine.  We will check an Arterial Blood Gas to determine if you are retaining CO2  Continue supplemental oxygen with goal oxygen level 88-92%, we will place order for a home concentrator that provides up to 10L of oxygen  Try breztri inhaler 2 puffs twice daily  Follow up in 3 months

## 2022-07-17 NOTE — Progress Notes (Signed)
Synopsis: Referred in June 2023 for second opinion on lung diseae by Christa See, MD  Subjective:   PATIENT ID: [name] GENDER: male DOB: 1951-11-17, MRN: [id number]  HPI  Chief Complaint  Patient presents with   Follow-up    Has had a productive cough for one week    Anastasio Wogan is a 71 year old male, former smoker with history of hypertension who returns to pulmonary clinic for pulmonary fibrosis.    His autoimmune panel is negative. He reports on going dyspnea and low oxygen levels at times. He reports involuntary jerking/shaking episodes where he does not lose consciousness. He was evaluated in the [place] [facility] without any abnormalities of his labs. I am unable to review workup that occurred. He denies having head CT scan done. He has a 5L home oxygen concentrator. He previously had a 10L oxygen concentrator.   Initial OV 05/15/22 He has been followed at [facility] for shortness of breath. He has been diagnosed with pulmonary fibrosis based on CT chest scans over recent years. He was also prescribed anti-fibrotic therapy which he did not tolerated due to GI side effects. He is currently using 2L of O2.   He is very limited in his physical activity due to shortness of breath. He has intermittent cough with occasional sputum production. He denies wheezing. He is using symbicort 80-4.20mg 2 puffs twice daily. He is using azelastine and fluticasone for nasal congestion and drainage. He is taking montelukast '10mg'$  daily. He is taking pantoprazole for GERD.   He is a former smoker with around a 30 pack year history. He had second hand smoke exposure growing up. He worked in [employer] 25 years with significant dust exposure. 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   His mother had lung  cancer at 89 His father had pancreatic cancer.   Past Medical History:  Diagnosis Date   Angio-edema    Asthma    High blood pressure      Family History  Problem Relation Age of Onset   COPD Brother        smoker   CAD Father        MI in his 46's    Social History   Socioeconomic History   Marital status: Married    Spouse name: Not on file   Number of children: Not on file   Years of education: Not on file   Highest education level: Not on file  Occupational History   Not on file  Tobacco Use   Smoking status: Former    Packs/day: 1.00    Years: 30.00    Total pack years: 30.00    Types: Cigarettes    Quit date: 12/15/2010    Years since quitting: 11.5   Smokeless tobacco: Never  Vaping Use   Vaping Use: Never used  Substance and Sexual Activity   Alcohol use: Yes    Comment: 1 a day   Drug use: Not Currently   Sexual activity: Not on file  Other Topics Concern   Not on file  Social History Narrative   Not on file   Social Determinants of Health   Financial Resource Strain: Not on file  Food Insecurity: Not on file  Transportation Needs: Not on file  Physical Activity: Not on file  Stress: Not on file  Social Connections: Not on file  Intimate Partner Violence: Not on file     Allergies  Allergen Reactions   Amoxicillin-Pot Clavulanate Nausea And Vomiting   Levaquin [Levofloxacin]     Joint pain     Outpatient Medications Prior to Visit  Medication Sig Dispense Refill   albuterol (VENTOLIN HFA) 108 (90 Base) MCG/ACT inhaler Can inhale two puffs every four to six hours as needed for cough or wheeze.  Can use 15 minutes prior to exercise if needed. 18 g 0   ALPRAZolam (XANAX) 0.25 MG tablet Take 0.25 mg by mouth 3 (three) times daily.     fluticasone (FLONASE) 50 MCG/ACT nasal spray Place 2 sprays into both nostrils 3 times/day as needed-between meals & bedtime for allergies or rhinitis.     furosemide (LASIX) 40 MG tablet Take 20-40 mg by mouth  daily as needed.     KLOR-CON M10 10 MEQ tablet Take 10 mEq by mouth daily.     pantoprazole (PROTONIX) 40 MG tablet TAKE 1 TABLET (40 MG TOTAL) BY MOUTH DAILY. TAKE 30-60 MIN BEFORE FIRST MEAL OF THE DAY 90 tablet 0   SYMBICORT 80-4.5 MCG/ACT inhaler Inhale 2 puffs into the lungs in the morning and at bedtime. 1 each 11   azelastine (ASTELIN) 0.1 % nasal spray Place 1 spray into both nostrils 2 (two) times daily. USE ALONG WITH FLONASE (Patient not taking: Reported on 07/17/2022) 30 mL 5   loratadine (CLARITIN) 10 MG tablet Take 1-2 tablets 1 time per day as needed. (Patient not taking: Reported on 07/17/2022) 60 tablet 1   losartan (COZAAR) 25 MG tablet Take 25 mg by mouth daily. for high blood pressure (Patient not taking: Reported on 07/17/2022)  12   montelukast (SINGULAIR) 10 MG tablet TAKE 1 TABLET BY MOUTH EVERYDAY AT BEDTIME (Patient not taking: Reported on 07/17/2022) 90 tablet 0   No facility-administered medications prior to visit.   Review of Systems  Constitutional:  Negative for chills, fever, malaise/fatigue and weight loss.  HENT:  Negative for congestion, sinus pain and sore throat.   Eyes: Negative.   Respiratory:  Positive for cough, sputum production and shortness of breath. Negative for hemoptysis and wheezing.   Cardiovascular:  Negative for chest pain, palpitations, orthopnea, claudication and leg swelling.  Gastrointestinal:  Negative for abdominal pain, heartburn, nausea and vomiting.  Genitourinary: Negative.   Musculoskeletal:  Negative for joint pain and myalgias.  Skin:  Negative for itching and rash.  Neurological:  Negative for weakness.  Endo/Heme/Allergies: Negative.   Psychiatric/Behavioral:  The patient is nervous/anxious.    Objective:   Vitals:   07/17/22 0916  BP: 110/72  Pulse: (!) 102  SpO2: 91%   Physical Exam Constitutional:      General: He is not in acute distress. HENT:     Head: Normocephalic and atraumatic.  Cardiovascular:     Rate and  Rhythm: Normal rate and regular rhythm.     Pulses: Normal pulses.     Heart sounds: Normal heart sounds. No murmur heard. Pulmonary:     Effort: Pulmonary effort is normal.     Breath sounds: Rales (bases) present.  Musculoskeletal:     Right lower leg: No edema.     Left lower leg: No edema.  Skin:    General: Skin is warm and dry.  Neurological:     General: No focal deficit present.     Mental Status: He is alert.  Psychiatric:  Mood and Affect: Mood normal.        Behavior: Behavior normal.        Thought Content: Thought content normal.        Judgment: Judgment normal.     CBC    Component Value Date/Time   WBC 8.7 01/29/2018 1707   RBC 4.96 01/29/2018 1707   HGB 15.5 01/29/2018 1707   HCT 45.8 01/29/2018 1707   PLT 375.0 01/29/2018 1707   MCV 92.4 01/29/2018 1707   MCHC 33.8 01/29/2018 1707   RDW 13.4 01/29/2018 1707   LYMPHSABS 1.8 01/29/2018 1707   MONOABS 1.2 (H) 01/29/2018 1707   EOSABS 0.3 01/29/2018 1707   BASOSABS 0.1 01/29/2018 1707      Latest Ref Rng & Units 07/19/2018   10:44 AM  BMP  Glucose 65 - 99 mg/dL 104   BUN 8 - 27 mg/dL 12   Creatinine 0.76 - 1.27 mg/dL 1.05   BUN/Creat Ratio 10 - 24 11   Sodium 134 - 144 mmol/L 138   Potassium 3.5 - 5.2 mmol/L 4.7   Chloride 96 - 106 mmol/L 105   CO2 20 - 29 mmol/L 21   Calcium 8.6 - 10.2 mg/dL 9.5    Chest imaging: CT Chest 03/2022 Mediastinum/Nodes: There are multiple prominent mediastinal lymph nodes which are unchanged from the prior exam and likely reactive. The thyroid is unremarkable. The esophagus is unremarkable.  Lungs/Pleura: There is mild centrilobular and paraseptal emphysema. Unchanged basilar predominant fibrotic interstitial lung disease with subpleural reticulation, interlobular septal thickening and traction bronchiectasis without definite honeycombing. There is no new airspace disease. No suspicious pulmonary nodules. Biapical pleuroparenchymal scarring. No pleural  effusion. No pneumothorax.   HRCT Chest 04/2021 1. Redemonstrated mild pulmonary fibrosis in a pattern with apical to basal gradient, featuring irregular peripheral interstitial opacity, septal thickening, areas of subpleural bronchiolectasis, and without associated honeycombing. Scattered, associated ground-glass. Fibrotic findings are not significantly changed compared to immediate prior examination dated 10/09/2020, but as previously noted are clearly worsened over time on sequential prior examinations dating back to 08/07/2016. Findings are consistent with a "probable UIP" pattern by pulmonary fibrosis criteria. Findings are categorized as probable UIP per consensus guidelines: Diagnosis of Idiopathic Pulmonary Fibrosis: An Official ATS/ERS/JRS/ALAT Clinical Practice Guideline. [place], Iss 5, [phone], Aug 15 2017. 2. Unchanged prominent mediastinal lymph nodes, likely reactive. 3. Emphysema. 4. Coronary artery disease.   PFT:     No data to display          Labs:  Path:  Echo 2019: LV EF 55-60%. Grade I diastolic dysfunction. RV is mildly dilated. RA is mildly dilated.  Heart Catheterization:  Assessment & Plan:   [place] - Plan: magic mouthwash (nystatin, lidocaine, diphenhydrAMINE) suspension  Discussion: Maor Meckel is a 71 year old male, former smoker with history of hypertension who returns to pulmonary clinic for pulmonary fibrosis.   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   He did not tolerate previous trial of antifibrotic therapy due to GI side effects.   We will check CMP and ABG to further evaluate these jerking/shaking episodes are not related to hypercarbia.   Overall, he wishes to focus on quality of life.   We will place order for home concentrator  up to 10L to his DME.  He  can try breztri inhaler and monitor for any benefit in his breathing. We will request his sleep study results from [facility].  Follow up in 3 months.  Freda Jackson, MD [facility] Office: [phone]    Current Outpatient Medications:    albuterol (VENTOLIN HFA) 108 (90 Base) MCG/ACT inhaler, Can inhale two puffs every four to six hours as needed for cough or wheeze.  Can use 15 minutes prior to exercise if needed., Disp: 18 g, Rfl: 0   ALPRAZolam (XANAX) 0.25 MG tablet, Take 0.25 mg by mouth 3 (three) times daily., Disp: , Rfl:    fluticasone (FLONASE) 50 MCG/ACT nasal spray, Place 2 sprays into both nostrils 3 times/day as needed-between meals & bedtime for allergies or rhinitis., Disp: , Rfl:    furosemide (LASIX) 40 MG tablet, Take 20-40 mg by mouth daily as needed., Disp: , Rfl:    KLOR-CON M10 10 MEQ tablet, Take 10 mEq by mouth daily., Disp: , Rfl:    magic mouthwash (nystatin, lidocaine, diphenhydrAMINE) suspension, Take 5 mLs by mouth 4 (four) times daily for 14 days., Disp: 280 mL, Rfl: 0   pantoprazole (PROTONIX) 40 MG tablet, TAKE 1 TABLET (40 MG TOTAL) BY MOUTH DAILY. TAKE 30-60 MIN BEFORE FIRST MEAL OF THE DAY, Disp: 90 tablet, Rfl: 0   SYMBICORT 80-4.5 MCG/ACT inhaler, Inhale 2 puffs into the lungs in the morning and at bedtime., Disp: 1 each, Rfl: 11

## 2022-07-17 NOTE — Telephone Encounter (Signed)
Per pharmacy, lidocaine is out of stock.

## 2022-07-18 ENCOUNTER — Telehealth: Payer: Self-pay | Admitting: Pulmonary Disease

## 2022-07-18 MED ORDER — NYSTATIN 100000 UNIT/ML MT SUSP: 5.0000 mL | Freq: Four times a day (QID) | OROMUCOSAL | 0 refills | Status: AC

## 2022-07-18 NOTE — Telephone Encounter (Signed)
Called pharmacy and was notified that they are on back order with lidocaine and are unable to do the magic mouth wash. Verbal orders from Dr Erin Fulling that it is ok to send in regular Nystatin. Nothing further needed

## 2022-07-24 DIAGNOSIS — Z79899 Other long term (current) drug therapy: Secondary | ICD-10-CM | POA: Diagnosis not present

## 2022-07-24 DIAGNOSIS — R310 Gross hematuria: Secondary | ICD-10-CM | POA: Diagnosis not present

## 2022-07-24 DIAGNOSIS — Z0000 Encounter for general adult medical examination without abnormal findings: Secondary | ICD-10-CM | POA: Diagnosis not present

## 2022-07-24 DIAGNOSIS — N200 Calculus of kidney: Secondary | ICD-10-CM | POA: Diagnosis not present

## 2022-07-24 DIAGNOSIS — Z8546 Personal history of malignant neoplasm of prostate: Secondary | ICD-10-CM | POA: Diagnosis not present

## 2022-07-24 DIAGNOSIS — G720 Drug-induced myopathy: Secondary | ICD-10-CM | POA: Diagnosis not present

## 2022-07-24 DIAGNOSIS — C61 Malignant neoplasm of prostate: Secondary | ICD-10-CM | POA: Diagnosis not present

## 2022-07-24 DIAGNOSIS — T466X5A Adverse effect of antihyperlipidemic and antiarteriosclerotic drugs, initial encounter: Secondary | ICD-10-CM | POA: Diagnosis not present

## 2022-07-24 DIAGNOSIS — E785 Hyperlipidemia, unspecified: Secondary | ICD-10-CM | POA: Diagnosis not present

## 2022-07-24 DIAGNOSIS — R7302 Impaired glucose tolerance (oral): Secondary | ICD-10-CM | POA: Diagnosis not present

## 2022-07-24 DIAGNOSIS — J982 Interstitial emphysema: Secondary | ICD-10-CM | POA: Diagnosis not present

## 2022-08-05 DIAGNOSIS — R609 Edema, unspecified: Secondary | ICD-10-CM | POA: Diagnosis not present

## 2022-08-05 DIAGNOSIS — Z6828 Body mass index (BMI) 28.0-28.9, adult: Secondary | ICD-10-CM | POA: Diagnosis not present

## 2022-08-05 DIAGNOSIS — R34 Anuria and oliguria: Secondary | ICD-10-CM | POA: Diagnosis not present

## 2022-08-06 DIAGNOSIS — N3289 Other specified disorders of bladder: Secondary | ICD-10-CM | POA: Diagnosis not present

## 2022-08-11 DIAGNOSIS — I361 Nonrheumatic tricuspid (valve) insufficiency: Secondary | ICD-10-CM | POA: Diagnosis not present

## 2022-08-11 DIAGNOSIS — I130 Hypertensive heart and chronic kidney disease with heart failure and stage 1 through stage 4 chronic kidney disease, or unspecified chronic kidney disease: Secondary | ICD-10-CM | POA: Diagnosis not present

## 2022-08-11 DIAGNOSIS — I272 Pulmonary hypertension, unspecified: Secondary | ICD-10-CM | POA: Diagnosis not present

## 2022-08-11 DIAGNOSIS — J8410 Pulmonary fibrosis, unspecified: Secondary | ICD-10-CM | POA: Diagnosis not present

## 2022-08-11 DIAGNOSIS — E876 Hypokalemia: Secondary | ICD-10-CM | POA: Diagnosis not present

## 2022-08-11 DIAGNOSIS — C61 Malignant neoplasm of prostate: Secondary | ICD-10-CM | POA: Diagnosis not present

## 2022-08-11 DIAGNOSIS — N182 Chronic kidney disease, stage 2 (mild): Secondary | ICD-10-CM | POA: Diagnosis not present

## 2022-08-11 DIAGNOSIS — I10 Essential (primary) hypertension: Secondary | ICD-10-CM | POA: Diagnosis not present

## 2022-08-11 DIAGNOSIS — J449 Chronic obstructive pulmonary disease, unspecified: Secondary | ICD-10-CM | POA: Diagnosis not present

## 2022-08-11 DIAGNOSIS — G9340 Encephalopathy, unspecified: Secondary | ICD-10-CM | POA: Diagnosis not present

## 2022-08-11 DIAGNOSIS — I129 Hypertensive chronic kidney disease with stage 1 through stage 4 chronic kidney disease, or unspecified chronic kidney disease: Secondary | ICD-10-CM | POA: Diagnosis not present

## 2022-08-11 DIAGNOSIS — I509 Heart failure, unspecified: Secondary | ICD-10-CM | POA: Diagnosis not present

## 2022-08-11 DIAGNOSIS — N1831 Chronic kidney disease, stage 3a: Secondary | ICD-10-CM | POA: Diagnosis not present

## 2022-08-11 DIAGNOSIS — R569 Unspecified convulsions: Secondary | ICD-10-CM | POA: Diagnosis not present

## 2022-08-11 DIAGNOSIS — R0902 Hypoxemia: Secondary | ICD-10-CM | POA: Diagnosis not present

## 2022-08-11 DIAGNOSIS — I21A1 Myocardial infarction type 2: Secondary | ICD-10-CM | POA: Diagnosis not present

## 2022-08-11 DIAGNOSIS — J811 Chronic pulmonary edema: Secondary | ICD-10-CM | POA: Diagnosis not present

## 2022-08-11 DIAGNOSIS — J9811 Atelectasis: Secondary | ICD-10-CM | POA: Diagnosis not present

## 2022-08-11 DIAGNOSIS — K219 Gastro-esophageal reflux disease without esophagitis: Secondary | ICD-10-CM | POA: Diagnosis not present

## 2022-08-11 DIAGNOSIS — Z9981 Dependence on supplemental oxygen: Secondary | ICD-10-CM | POA: Diagnosis not present

## 2022-08-11 DIAGNOSIS — J9621 Acute and chronic respiratory failure with hypoxia: Secondary | ICD-10-CM | POA: Diagnosis not present

## 2022-08-12 DIAGNOSIS — I361 Nonrheumatic tricuspid (valve) insufficiency: Secondary | ICD-10-CM | POA: Diagnosis not present

## 2022-08-14 DIAGNOSIS — I272 Pulmonary hypertension, unspecified: Secondary | ICD-10-CM | POA: Diagnosis not present

## 2022-08-14 DIAGNOSIS — Z8546 Personal history of malignant neoplasm of prostate: Secondary | ICD-10-CM | POA: Diagnosis not present

## 2022-08-14 DIAGNOSIS — J9621 Acute and chronic respiratory failure with hypoxia: Secondary | ICD-10-CM | POA: Diagnosis not present

## 2022-08-14 DIAGNOSIS — G319 Degenerative disease of nervous system, unspecified: Secondary | ICD-10-CM | POA: Diagnosis not present

## 2022-08-14 DIAGNOSIS — K219 Gastro-esophageal reflux disease without esophagitis: Secondary | ICD-10-CM | POA: Diagnosis not present

## 2022-08-14 DIAGNOSIS — G9340 Encephalopathy, unspecified: Secondary | ICD-10-CM | POA: Diagnosis not present

## 2022-08-14 DIAGNOSIS — I079 Rheumatic tricuspid valve disease, unspecified: Secondary | ICD-10-CM | POA: Diagnosis not present

## 2022-08-14 DIAGNOSIS — R569 Unspecified convulsions: Secondary | ICD-10-CM | POA: Diagnosis not present

## 2022-08-14 DIAGNOSIS — Z9981 Dependence on supplemental oxygen: Secondary | ICD-10-CM | POA: Diagnosis not present

## 2022-08-14 DIAGNOSIS — J449 Chronic obstructive pulmonary disease, unspecified: Secondary | ICD-10-CM | POA: Diagnosis not present

## 2022-08-14 DIAGNOSIS — N1831 Chronic kidney disease, stage 3a: Secondary | ICD-10-CM | POA: Diagnosis not present

## 2022-08-14 DIAGNOSIS — Z135 Encounter for screening for eye and ear disorders: Secondary | ICD-10-CM | POA: Diagnosis not present

## 2022-08-14 DIAGNOSIS — R9082 White matter disease, unspecified: Secondary | ICD-10-CM | POA: Diagnosis not present

## 2022-08-14 DIAGNOSIS — J8410 Pulmonary fibrosis, unspecified: Secondary | ICD-10-CM | POA: Diagnosis not present

## 2022-08-14 DIAGNOSIS — I129 Hypertensive chronic kidney disease with stage 1 through stage 4 chronic kidney disease, or unspecified chronic kidney disease: Secondary | ICD-10-CM | POA: Diagnosis not present

## 2022-08-21 DIAGNOSIS — M6281 Muscle weakness (generalized): Secondary | ICD-10-CM | POA: Diagnosis not present

## 2022-08-21 DIAGNOSIS — J982 Interstitial emphysema: Secondary | ICD-10-CM | POA: Diagnosis not present

## 2022-08-21 DIAGNOSIS — Z9981 Dependence on supplemental oxygen: Secondary | ICD-10-CM | POA: Diagnosis not present

## 2022-08-21 DIAGNOSIS — J449 Chronic obstructive pulmonary disease, unspecified: Secondary | ICD-10-CM | POA: Diagnosis not present

## 2022-08-21 DIAGNOSIS — I5020 Unspecified systolic (congestive) heart failure: Secondary | ICD-10-CM | POA: Diagnosis not present

## 2022-08-21 DIAGNOSIS — R569 Unspecified convulsions: Secondary | ICD-10-CM | POA: Diagnosis not present

## 2022-08-21 DIAGNOSIS — J989 Respiratory disorder, unspecified: Secondary | ICD-10-CM | POA: Diagnosis not present

## 2022-08-21 DIAGNOSIS — I2723 Pulmonary hypertension due to lung diseases and hypoxia: Secondary | ICD-10-CM | POA: Diagnosis not present

## 2022-08-21 DIAGNOSIS — J9611 Chronic respiratory failure with hypoxia: Secondary | ICD-10-CM | POA: Diagnosis not present

## 2022-09-14 DEATH — deceased

## 2022-10-09 ENCOUNTER — Ambulatory Visit: Payer: Medicare PPO | Admitting: Pulmonary Disease
